# Patient Record
Sex: Female | Born: 1985 | Race: White | Hispanic: No | Marital: Married | Smoking: Never smoker
Health system: Southern US, Community
[De-identification: ages and names within clinical notes are randomized; demographics above are authoritative.]

## PROBLEM LIST (undated history)

## (undated) DIAGNOSIS — Z803 Family history of malignant neoplasm of breast: Secondary | ICD-10-CM

## (undated) DIAGNOSIS — F419 Anxiety disorder, unspecified: Secondary | ICD-10-CM

## (undated) DIAGNOSIS — Z789 Other specified health status: Secondary | ICD-10-CM

## (undated) DIAGNOSIS — D241 Benign neoplasm of right breast: Secondary | ICD-10-CM

## (undated) HISTORY — DX: Other disorders of iron metabolism: E83.19

## (undated) HISTORY — PX: OTHER SURGICAL HISTORY: SHX169

## (undated) HISTORY — DX: Family history of malignant neoplasm of breast: Z80.3

## (undated) HISTORY — PX: BREAST SURGERY: SHX581

## (undated) HISTORY — DX: Benign neoplasm of right breast: D24.1

---
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
## 2013-10-30 ENCOUNTER — Ambulatory Visit: Payer: Self-pay | Admitting: Oncology

## 2013-11-20 ENCOUNTER — Ambulatory Visit: Payer: Self-pay | Admitting: Internal Medicine

## 2013-12-16 ENCOUNTER — Ambulatory Visit: Payer: Self-pay | Admitting: Internal Medicine

## 2014-01-15 ENCOUNTER — Ambulatory Visit: Payer: Self-pay | Admitting: Internal Medicine

## 2014-02-06 LAB — CBC CANCER CENTER
Basophil #: 0.1 x10 3/mm (ref 0.0–0.1)
Basophil %: 2.1 %
EOS ABS: 0.1 x10 3/mm (ref 0.0–0.7)
Eosinophil %: 2.9 %
HCT: 47.2 % — ABNORMAL HIGH (ref 35.0–47.0)
HGB: 15.9 g/dL (ref 12.0–16.0)
LYMPHS ABS: 2.3 x10 3/mm (ref 1.0–3.6)
Lymphocyte %: 54.6 %
MCH: 33.3 pg (ref 26.0–34.0)
MCHC: 33.8 g/dL (ref 32.0–36.0)
MCV: 99 fL (ref 80–100)
MONO ABS: 0.3 x10 3/mm (ref 0.2–0.9)
Monocyte %: 7.8 %
Neutrophil #: 1.4 x10 3/mm (ref 1.4–6.5)
Neutrophil %: 32.6 %
Platelet: 271 x10 3/mm (ref 150–440)
RBC: 4.79 10*6/uL (ref 3.80–5.20)
RDW: 12.4 % (ref 11.5–14.5)
WBC: 4.3 x10 3/mm (ref 3.6–11.0)

## 2014-02-06 LAB — IRON AND TIBC
IRON SATURATION: 81 %
Iron Bind.Cap.(Total): 284 ug/dL (ref 250–450)
Iron: 230 ug/dL — ABNORMAL HIGH (ref 50–170)
UNBOUND IRON-BIND. CAP.: 54 ug/dL

## 2014-02-06 LAB — FERRITIN: FERRITIN (ARMC): 105 ng/mL (ref 8–388)

## 2014-02-15 ENCOUNTER — Ambulatory Visit: Payer: Self-pay | Admitting: Internal Medicine

## 2014-03-06 LAB — CBC CANCER CENTER
BASOS PCT: 2.9 %
Basophil #: 0.1 x10 3/mm (ref 0.0–0.1)
EOS PCT: 3.2 %
Eosinophil #: 0.1 x10 3/mm (ref 0.0–0.7)
HCT: 44.3 % (ref 35.0–47.0)
HGB: 14.7 g/dL (ref 12.0–16.0)
LYMPHS ABS: 2.3 x10 3/mm (ref 1.0–3.6)
LYMPHS PCT: 55 %
MCH: 33.3 pg (ref 26.0–34.0)
MCHC: 33.3 g/dL (ref 32.0–36.0)
MCV: 100 fL (ref 80–100)
Monocyte #: 0.4 x10 3/mm (ref 0.2–0.9)
Monocyte %: 9.6 %
NEUTROS PCT: 29.3 %
Neutrophil #: 1.2 x10 3/mm — ABNORMAL LOW (ref 1.4–6.5)
Platelet: 265 x10 3/mm (ref 150–440)
RBC: 4.42 10*6/uL (ref 3.80–5.20)
RDW: 13 % (ref 11.5–14.5)
WBC: 4.2 x10 3/mm (ref 3.6–11.0)

## 2014-03-06 LAB — IRON AND TIBC
IRON BIND. CAP.(TOTAL): 296 ug/dL (ref 250–450)
Iron Saturation: 74 %
Iron: 218 ug/dL — ABNORMAL HIGH (ref 50–170)
UNBOUND IRON-BIND. CAP.: 78 ug/dL

## 2014-03-06 LAB — FERRITIN: Ferritin (ARMC): 47 ng/mL (ref 8–388)

## 2014-03-17 ENCOUNTER — Ambulatory Visit: Payer: Self-pay | Admitting: Oncology

## 2014-06-11 ENCOUNTER — Ambulatory Visit: Payer: Self-pay | Admitting: Oncology

## 2014-06-16 ENCOUNTER — Ambulatory Visit
Admit: 2014-06-16 | Disposition: A | Discharge status: Home / Self Care | Payer: Self-pay | Attending: Oncology | Admitting: Oncology

## 2014-12-10 ENCOUNTER — Inpatient Hospital Stay: Payer: 59 | Attending: Oncology

## 2014-12-10 ENCOUNTER — Ambulatory Visit: Payer: Self-pay | Admitting: Oncology

## 2014-12-10 ENCOUNTER — Other Ambulatory Visit: Payer: Self-pay | Admitting: *Deleted

## 2014-12-10 ENCOUNTER — Other Ambulatory Visit: Payer: Self-pay

## 2014-12-10 ENCOUNTER — Inpatient Hospital Stay: Payer: 59

## 2014-12-10 ENCOUNTER — Inpatient Hospital Stay: Payer: 59 | Admitting: Family Medicine

## 2014-12-11 ENCOUNTER — Inpatient Hospital Stay: Payer: 59

## 2014-12-11 LAB — CBC WITH DIFFERENTIAL/PLATELET
BASOS ABS: 0 10*3/uL (ref 0–0.1)
BASOS PCT: 1 %
Eosinophils Absolute: 0.2 10*3/uL (ref 0–0.7)
Eosinophils Relative: 3 %
HEMATOCRIT: 40.4 % (ref 35.0–47.0)
HEMOGLOBIN: 14.2 g/dL (ref 12.0–16.0)
Lymphocytes Relative: 32 %
Lymphs Abs: 1.9 10*3/uL (ref 1.0–3.6)
MCH: 33.8 pg (ref 26.0–34.0)
MCHC: 35.2 g/dL (ref 32.0–36.0)
MCV: 96 fL (ref 80.0–100.0)
MONOS PCT: 6 %
Monocytes Absolute: 0.4 10*3/uL (ref 0.2–0.9)
NEUTROS ABS: 3.5 10*3/uL (ref 1.4–6.5)
NEUTROS PCT: 58 %
Platelets: 227 10*3/uL (ref 150–440)
RBC: 4.21 MIL/uL (ref 3.80–5.20)
RDW: 11.8 % (ref 11.5–14.5)
WBC: 6.1 10*3/uL (ref 3.6–11.0)

## 2014-12-11 LAB — IRON AND TIBC
Iron: 264 ug/dL — ABNORMAL HIGH (ref 28–170)
SATURATION RATIOS: 82 % — AB (ref 10.4–31.8)
TIBC: 322 ug/dL (ref 250–450)
UIBC: 58 ug/dL

## 2014-12-11 LAB — FERRITIN: Ferritin: 52 ng/mL (ref 11–307)

## 2015-01-26 ENCOUNTER — Telehealth: Payer: Self-pay | Admitting: *Deleted

## 2015-01-26 NOTE — Telephone Encounter (Signed)
Pder Dr Orlie Dakin, she does not need a phlebotomy and to schedule her an appt for Feb 2017 for lab, md, possible phlebotomy Message sent to schedulers and left a message on pt VM regarding this

## 2015-01-26 NOTE — Telephone Encounter (Signed)
Called stating that she had her labs drawn and she was wondering if she is going to get a call for an appt to see md and get a phlebotomy

## 2015-01-26 NOTE — Telephone Encounter (Signed)
Have left message for pt to find out where she had her labs drawn

## 2015-06-02 ENCOUNTER — Other Ambulatory Visit: Payer: 59

## 2015-06-02 ENCOUNTER — Inpatient Hospital Stay (HOSPITAL_BASED_OUTPATIENT_CLINIC_OR_DEPARTMENT_OTHER): Payer: 59

## 2015-06-02 ENCOUNTER — Encounter: Payer: Self-pay | Admitting: Oncology

## 2015-06-02 ENCOUNTER — Inpatient Hospital Stay: Payer: 59 | Attending: Oncology | Admitting: Oncology

## 2015-06-02 ENCOUNTER — Inpatient Hospital Stay: Payer: 59

## 2015-06-02 DIAGNOSIS — Z79899 Other long term (current) drug therapy: Secondary | ICD-10-CM | POA: Insufficient documentation

## 2015-06-02 LAB — CBC WITH DIFFERENTIAL/PLATELET
Basophils Absolute: 0.1 10*3/uL (ref 0–0.1)
Basophils Relative: 1 %
EOS PCT: 6 %
Eosinophils Absolute: 0.3 10*3/uL (ref 0–0.7)
HCT: 41 % (ref 35.0–47.0)
Hemoglobin: 14.3 g/dL (ref 12.0–16.0)
LYMPHS ABS: 1.9 10*3/uL (ref 1.0–3.6)
LYMPHS PCT: 42 %
MCH: 33.6 pg (ref 26.0–34.0)
MCHC: 34.8 g/dL (ref 32.0–36.0)
MCV: 96.7 fL (ref 80.0–100.0)
MONO ABS: 0.4 10*3/uL (ref 0.2–0.9)
MONOS PCT: 9 %
Neutro Abs: 1.8 10*3/uL (ref 1.4–6.5)
Neutrophils Relative %: 42 %
PLATELETS: 253 10*3/uL (ref 150–440)
RBC: 4.24 MIL/uL (ref 3.80–5.20)
RDW: 12.2 % (ref 11.5–14.5)
WBC: 4.4 10*3/uL (ref 3.6–11.0)

## 2015-06-02 LAB — FERRITIN: FERRITIN: 55 ng/mL (ref 11–307)

## 2015-06-02 LAB — IRON AND TIBC
IRON: 199 ug/dL — AB (ref 28–170)
Saturation Ratios: 64 % — ABNORMAL HIGH (ref 10.4–31.8)
TIBC: 313 ug/dL (ref 250–450)
UIBC: 114 ug/dL

## 2015-06-02 NOTE — Progress Notes (Signed)
Patient wold like to discuss how often she should get phlebotomy.

## 2015-06-02 NOTE — Progress Notes (Signed)
[facility]  Telephone:([phone] [phone]  ID: Tammy Barton OB: September 28, 1985  MR#: [id number]  AOZ#:[id number]  Patient Care Team: No Pcp Per Patient as PCP - General (General Practice)  CHIEF COMPLAINT:  Chief Complaint  Patient presents with  . Hemochromatosis    INTERVAL HISTORY: Patient last seen in clinic in February 2016. Patient returns to clinic today for further evaluation, laboratory work, and consideration of additional phlebotomy. She continues to feel well and is asymptomatic. She has no neurologic complaints. She has a good appetite and denies weight loss. She has had no recent fevers or illnesses. She has no chest pain or shortness of breath. She denies any nausea, vomiting, constipation, or diarrhea. She has no melena or hematochezia. She has no urinary complaints. Patient offers no specific complaints today.   REVIEW OF SYSTEMS:   Review of Systems  Constitutional: Negative.  Negative for malaise/fatigue.  Respiratory: Negative.  Negative for shortness of breath.   Cardiovascular: Negative.  Negative for chest pain.  Gastrointestinal: Negative.   Genitourinary: Negative.   Musculoskeletal: Negative.   Neurological: Negative.  Negative for weakness.    As per HPI. Otherwise, a complete review of systems is negatve.  PAST MEDICAL HISTORY: No past medical history on file.  PAST SURGICAL HISTORY: No past surgical history on file.  FAMILY HISTORY: Reviewed and unchanged. No reported history of malignancy or chronic disease.     ADVANCED DIRECTIVES:    HEALTH MAINTENANCE: Social History  Substance Use Topics  . Smoking status: Never Smoker   . Smokeless tobacco: Never Used  . Alcohol Use: No     Colonoscopy:  PAP:  Bone density:  Lipid panel:  Allergies no known allergies  Current Outpatient Prescriptions  Medication Sig Dispense Refill  . Norgestimate-Ethinyl Estradiol Triphasic (TRINESSA, 28,)  0.18/0.215/0.25 MG-35 MCG tablet Take by mouth.     No current facility-administered medications for this visit.    OBJECTIVE: Filed Vitals:   06/02/15 1443  BP: 117/76  Pulse: 61  Temp: 97.6 F (36.4 C)  Resp: 16     There is no height on file to calculate BMI.    ECOG FS:0 - Asymptomatic  General: Well-developed, well-nourished, no acute distress. Eyes: Pink conjunctiva, anicteric sclera. Lungs: Clear to auscultation bilaterally. Heart: Regular rate and rhythm. No rubs, murmurs, or gallops. Abdomen: Soft, nontender, nondistended. No organomegaly noted, normoactive bowel sounds. Musculoskeletal: No edema, cyanosis, or clubbing. Neuro: Alert, answering all questions appropriately. Cranial nerves grossly intact. Skin: No rashes or petechiae noted. Psych: Normal affect.   LAB RESULTS:  No results found for: NA, K, CL, CO2, GLUCOSE, BUN, CREATININE, CALCIUM, PROT, ALBUMIN, AST, ALT, ALKPHOS, BILITOT, GFRNONAA, GFRAA  Lab Results  Component Value Date   WBC 4.4 06/02/2015   NEUTROABS 1.8 06/02/2015   HGB 14.3 06/02/2015   HCT 41.0 06/02/2015   MCV 96.7 06/02/2015   PLT 253 06/02/2015   Lab Results  Component Value Date   FERRITIN 55 06/02/2015   Lab Results  Component Value Date   IRON 199* 06/02/2015   TIBC 313 06/02/2015   IRONPCTSAT 64* 06/02/2015      STUDIES: No results found.  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  PLAN:    1. Hemachromatosis:  Patient's hemoglobin continues to be within normal limits. Iron saturation is elevated to 64%, but her ferritin remained stable at 55.  She does not require additional phlebotomy today. As long as patient is premenopausal, she likely will not  require phlebotomy.  Will have a goal ferritin of 50-100. Return to clinic in 1 year with repeat laboratory work, further evaluation, and consideration of additional phlebotomy.   Patient expressed understanding and was in agreement with  this plan. She also understands that She can call clinic at any time with any questions, concerns, or complaints.    Jeralyn Ruths, MD   06/02/2015 2:55 PM

## 2016-05-22 DIAGNOSIS — Z7189 Other specified counseling: Secondary | ICD-10-CM | POA: Insufficient documentation

## 2016-05-22 DIAGNOSIS — Z9889 Other specified postprocedural states: Secondary | ICD-10-CM | POA: Insufficient documentation

## 2016-05-22 DIAGNOSIS — Z7185 Encounter for immunization safety counseling: Secondary | ICD-10-CM | POA: Insufficient documentation

## 2016-05-31 ENCOUNTER — Other Ambulatory Visit: Payer: Self-pay | Admitting: Oncology

## 2016-05-31 NOTE — Progress Notes (Signed)
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  INTERVAL HISTORY: Patient returns to clinic for yearly follow-up. She currently feels well and is asymptomatic. She also reports she recently found out she is approximately [redacted] weeks pregnant. She has no neurologic complaints. She has a good appetite and denies weight loss. She has had no recent fevers or illnesses. She has no chest pain or shortness of breath. She denies any nausea, vomiting, constipation, or diarrhea. She has no melena or hematochezia. She has no urinary complaints. Patient offers no specific complaints today.   REVIEW OF SYSTEMS:   Review of Systems  Constitutional: Negative.  Negative for fever, malaise/fatigue and weight loss.  Respiratory: Negative.  Negative for cough and shortness of breath.   Cardiovascular: Negative.  Negative for chest pain and leg swelling.  Gastrointestinal: Negative.  Negative for abdominal pain.  Genitourinary: Negative.   Musculoskeletal: Negative.   Neurological: Negative.  Negative for weakness.  Psychiatric/Behavioral: Negative.  The patient is not nervous/anxious.     As per HPI. Otherwise, a complete review of systems is negative.  PAST MEDICAL HISTORY: No past medical history on file.  PAST SURGICAL HISTORY: No past surgical history on file.  FAMILY HISTORY: Reviewed and unchanged. No reported history of malignancy or chronic disease.     ADVANCED DIRECTIVES:    HEALTH MAINTENANCE: Social History  Substance Use Topics  . Smoking status: Never Smoker  . Smokeless tobacco: Never Used  . Alcohol use No     Colonoscopy:  PAP:  Bone density:  Lipid panel:  No Known Allergies  Current Outpatient Prescriptions    Medication Sig Dispense Refill  . IRON PO Take by mouth.    . Norgestimate-Ethinyl Estradiol Triphasic (TRINESSA, 28,) 0.18/0.215/0.25 MG-35 MCG tablet Take by mouth.     No current facility-administered medications for this visit.     OBJECTIVE: Vitals:   06/01/16 1031  BP: 117/78  Pulse: 71  Resp: 18  Temp: 97.4 F (36.3 C)     There is no height or weight on file to calculate BMI.    ECOG FS:0 - Asymptomatic  General: Well-developed, well-nourished, no acute distress. Eyes: Pink conjunctiva, anicteric sclera. Lungs: Clear to auscultation bilaterally. Heart: Regular rate and rhythm. No rubs, murmurs, or gallops. Abdomen: Soft, nontender, nondistended. No organomegaly noted, normoactive bowel sounds. Musculoskeletal: No edema, cyanosis, or clubbing. Neuro: Alert, answering all questions appropriately. Cranial nerves grossly intact. Skin: No rashes or petechiae noted. Psych: Normal affect.   LAB RESULTS:  No results found for: NA, K, CL, CO2, GLUCOSE, BUN, CREATININE, CALCIUM, PROT, ALBUMIN, AST, ALT, ALKPHOS, BILITOT, GFRNONAA, GFRAA  Lab Results  Component Value Date   WBC 5.6 06/01/2016   NEUTROABS 3.6 06/01/2016   HGB 13.2 06/01/2016   HCT 37.6 06/01/2016   MCV 96.2 06/01/2016   PLT 239 06/01/2016   Lab Results  Component Value Date   FERRITIN 52 06/01/2016   Lab Results  Component Value Date   IRON 192 (H) 06/01/2016   TIBC 234 (L) 06/01/2016   IRONPCTSAT 82 (H) 06/01/2016      STUDIES: No results found.  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  PLAN:    1. Hereditary hemochromatosis:  Patient's hemoglobin continues to be within normal limits. Iron saturation Has  trended up to 82%, but her ferritin remained stable at 52. She does not require additional phlebotomy and would be hesitant to pursue given her pregnancy.  Will have a goal ferritin of 50-100. Return to clinic in 1 year with repeat laboratory work,  further evaluation, and consideration of additional phlebotomy.  2. Pregnancy: Continue monitoring for OB/GYN. Patient's ferritin can likely increase secondary to pregnancy. Monitor.  Approximately 30 minutes was spent in discussion of which greater than 50% was consultation.  Patient expressed understanding and was in agreement with this plan. She also understands that She can call clinic at any time with any questions, concerns, or complaints.    Jeralyn Ruths, MD   06/03/2016 4:26 PM

## 2016-06-01 ENCOUNTER — Inpatient Hospital Stay (HOSPITAL_BASED_OUTPATIENT_CLINIC_OR_DEPARTMENT_OTHER): Payer: Managed Care, Other (non HMO) | Admitting: Oncology

## 2016-06-01 ENCOUNTER — Inpatient Hospital Stay: Payer: Managed Care, Other (non HMO)

## 2016-06-01 ENCOUNTER — Inpatient Hospital Stay: Payer: Managed Care, Other (non HMO) | Attending: Oncology

## 2016-06-01 LAB — CBC WITH DIFFERENTIAL/PLATELET
BASOS ABS: 0 10*3/uL (ref 0–0.1)
BASOS PCT: 1 %
Eosinophils Absolute: 0.1 10*3/uL (ref 0–0.7)
Eosinophils Relative: 2 %
HEMATOCRIT: 37.6 % (ref 35.0–47.0)
HEMOGLOBIN: 13.2 g/dL (ref 12.0–16.0)
Lymphocytes Relative: 27 %
Lymphs Abs: 1.5 10*3/uL (ref 1.0–3.6)
MCH: 33.7 pg (ref 26.0–34.0)
MCHC: 35.1 g/dL (ref 32.0–36.0)
MCV: 96.2 fL (ref 80.0–100.0)
Monocytes Absolute: 0.4 10*3/uL (ref 0.2–0.9)
Monocytes Relative: 7 %
NEUTROS ABS: 3.6 10*3/uL (ref 1.4–6.5)
NEUTROS PCT: 63 %
Platelets: 239 10*3/uL (ref 150–440)
RBC: 3.91 MIL/uL (ref 3.80–5.20)
RDW: 12 % (ref 11.5–14.5)
WBC: 5.6 10*3/uL (ref 3.6–11.0)

## 2016-06-01 LAB — IRON AND TIBC
IRON: 192 ug/dL — AB (ref 28–170)
Saturation Ratios: 82 % — ABNORMAL HIGH (ref 10.4–31.8)
TIBC: 234 ug/dL — AB (ref 250–450)
UIBC: 42 ug/dL

## 2016-06-01 LAB — FERRITIN: Ferritin: 52 ng/mL (ref 11–307)

## 2016-06-01 NOTE — Progress Notes (Signed)
Patient is here for follow up, she is about [redacted] weeks pregnant, no major complaints

## 2016-10-23 ENCOUNTER — Telehealth: Payer: Self-pay | Admitting: *Deleted

## 2016-10-23 NOTE — Telephone Encounter (Signed)
[facility], I would recommend no intervention at this time. Keep follow-up as scheduled.

## 2016-10-23 NOTE — Telephone Encounter (Signed)
I am not sure myself. I have not seen any faxed results. Her last clinic visit she was pregnant so possibly her OB is drawing labs.

## 2016-10-23 NOTE — Telephone Encounter (Signed)
I called patient who states she has a question regarding her Ferritin being 13, and if she should NOT take prenatal vitamins with iron (she is currently not taking).Please advise.

## 2016-10-23 NOTE — Telephone Encounter (Signed)
I see labs in care everywhere that patient had [facility], looks like an iron panel and ferritin. She also had CBC and additional labs drawn on 10/06/16.

## 2016-10-23 NOTE — Telephone Encounter (Signed)
Patient called asking that we return her call with her results. I see no results in computer, Did she have them drawn at [facility]? Please return her call

## 2016-10-23 NOTE — Telephone Encounter (Signed)
Per Dr Orlie DakinFinnegan, can take a prenatal vitamin without iron if she can find one. Patient advised of this and was relieved

## 2017-02-22 DIAGNOSIS — N393 Stress incontinence (female) (male): Secondary | ICD-10-CM | POA: Insufficient documentation

## 2017-05-28 ENCOUNTER — Inpatient Hospital Stay: Payer: Managed Care, Other (non HMO)

## 2017-05-31 ENCOUNTER — Ambulatory Visit: Payer: Managed Care, Other (non HMO) | Admitting: Oncology

## 2017-05-31 ENCOUNTER — Other Ambulatory Visit: Payer: Managed Care, Other (non HMO)

## 2017-06-03 NOTE — Progress Notes (Signed)
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  INTERVAL HISTORY: Patient returns to clinic for yearly follow-up. She recently had a baby in September 2018.  Her pregnancy and delivery were uneventful.  She reports her daughter is healthy.  She currently feels well and is asymptomatic. She has no neurologic complaints. She has a good appetite and denies weight loss. She has had no recent fevers or illnesses. She has no chest pain or shortness of breath. She denies any nausea, vomiting, constipation, or diarrhea. She has no melena or hematochezia. She has no urinary complaints. Patient offers no specific complaints today.   REVIEW OF SYSTEMS:   Review of Systems  Constitutional: Negative.  Negative for fever, malaise/fatigue and weight loss.  Respiratory: Negative.  Negative for cough and shortness of breath.   Cardiovascular: Negative.  Negative for chest pain and leg swelling.  Gastrointestinal: Negative.  Negative for abdominal pain.  Genitourinary: Negative.   Musculoskeletal: Negative.   Neurological: Negative.  Negative for weakness.  Psychiatric/Behavioral: Negative.  The patient is not nervous/anxious.     As per HPI. Otherwise, a complete review of systems is negative.  PAST MEDICAL HISTORY: No past medical history on file.  PAST SURGICAL HISTORY: Negative.  FAMILY HISTORY: Reviewed and unchanged. No reported history of malignancy or chronic disease.     ADVANCED DIRECTIVES:    HEALTH MAINTENANCE: Social History   Tobacco Use  . Smoking status: Never Smoker  . Smokeless tobacco: Never Used  Substance Use Topics  . Alcohol use: No  . Drug use: No     Colonoscopy:  PAP:  Bone density:  Lipid  panel:  No Known Allergies  Current Outpatient Medications  Medication Sig Dispense Refill  . IRON PO Take by mouth.    . Norgestimate-Ethinyl Estradiol Triphasic (TRINESSA, 28,) 0.18/0.215/0.25 MG-35 MCG tablet Take by mouth.     No current facility-administered medications for this visit.     OBJECTIVE: Vitals:   06/04/17 1549  BP: 117/74  Pulse: 74  Resp: 16  Temp: 97.6 F (36.4 C)     There is no height or weight on file to calculate BMI.    ECOG FS:0 - Asymptomatic  General: Well-developed, well-nourished, no acute distress. Eyes: Pink conjunctiva, anicteric sclera. Lungs: Clear to auscultation bilaterally. Heart: Regular rate and rhythm. No rubs, murmurs, or gallops. Abdomen: Soft, nontender, nondistended. No organomegaly noted, normoactive bowel sounds. Musculoskeletal: No edema, cyanosis, or clubbing. Neuro: Alert, answering all questions appropriately. Cranial nerves grossly intact. Skin: No rashes or petechiae noted. Psych: Normal affect.   LAB RESULTS:  No results found for: NA, K, CL, CO2, GLUCOSE, BUN, CREATININE, CALCIUM, PROT, ALBUMIN, AST, ALT, ALKPHOS, BILITOT, GFRNONAA, GFRAA  Lab Results  Component Value Date   WBC 4.7 06/04/2017   NEUTROABS 1.7 06/04/2017   HGB 14.3 06/04/2017   HCT 41.4 06/04/2017   MCV 95.8 06/04/2017   PLT 277 06/04/2017   Lab Results  Component Value Date   FERRITIN 52 06/01/2016   Lab Results  Component Value Date   IRON 192 (H) 06/01/2016   TIBC 234 (L) 06/01/2016   IRONPCTSAT 82 (H) 06/01/2016      STUDIES: No results found.  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  PLAN:    1. Hereditary  hemochromatosis:  Patient's hemoglobin continues to be within normal limits.  Previously her iron saturations were significantly elevated, but she had a normal ferritin.  Today's results are pending at time of dictation.  She does not require phlebotomy. Will have a goal ferritin of  50-100. Return to clinic in 1 year with repeat laboratory work, further evaluation, and consideration of additional phlebotomy.  2. Pregnancy: Patient reports it was uneventful and uncomplicated.  Her daughter was born in September 2018.  She was also counseled that her daughter is by definition a hemochromatosis mutation gene carrier, but likely will be asymptomatic and not have issues with iron overload in adulthood.  Approximately 20 minutes was spent in discussion of which greater than 50% was consultation.  Patient expressed understanding and was in agreement with this plan. She also understands that She can call clinic at any time with any questions, concerns, or complaints.    Tammy Ruthsimothy J Finnegan, MD   06/04/2017 4:14 PM

## 2017-06-04 ENCOUNTER — Inpatient Hospital Stay: Payer: Managed Care, Other (non HMO)

## 2017-06-04 ENCOUNTER — Inpatient Hospital Stay: Payer: Managed Care, Other (non HMO) | Attending: Oncology | Admitting: Oncology

## 2017-06-04 LAB — CBC WITH DIFFERENTIAL/PLATELET
BASOS PCT: 2 %
Basophils Absolute: 0.1 10*3/uL (ref 0–0.1)
EOS ABS: 0.3 10*3/uL (ref 0–0.7)
EOS PCT: 7 %
HCT: 41.4 % (ref 35.0–47.0)
HEMOGLOBIN: 14.3 g/dL (ref 12.0–16.0)
Lymphocytes Relative: 46 %
Lymphs Abs: 2.2 10*3/uL (ref 1.0–3.6)
MCH: 33 pg (ref 26.0–34.0)
MCHC: 34.4 g/dL (ref 32.0–36.0)
MCV: 95.8 fL (ref 80.0–100.0)
Monocytes Absolute: 0.4 10*3/uL (ref 0.2–0.9)
Monocytes Relative: 9 %
NEUTROS PCT: 36 %
Neutro Abs: 1.7 10*3/uL (ref 1.4–6.5)
PLATELETS: 277 10*3/uL (ref 150–440)
RBC: 4.32 MIL/uL (ref 3.80–5.20)
RDW: 12.2 % (ref 11.5–14.5)
WBC: 4.7 10*3/uL (ref 3.6–11.0)

## 2017-06-04 LAB — IRON AND TIBC
Iron: 159 ug/dL (ref 28–170)
SATURATION RATIOS: 66 % — AB (ref 10.4–31.8)
TIBC: 241 ug/dL — AB (ref 250–450)
UIBC: 82 ug/dL

## 2017-06-04 LAB — FERRITIN: Ferritin: 29 ng/mL (ref 11–307)

## 2017-06-04 NOTE — Progress Notes (Signed)
Pt in for follow up for hemachromatosis following having baby in September.Pt denies any concerns or difficulties at this time.

## 2018-05-30 ENCOUNTER — Inpatient Hospital Stay: Payer: 59 | Attending: Oncology

## 2018-05-30 LAB — CBC WITH DIFFERENTIAL/PLATELET
Abs Immature Granulocytes: 0.01 10*3/uL (ref 0.00–0.07)
BASOS ABS: 0.1 10*3/uL (ref 0.0–0.1)
BASOS PCT: 1 %
EOS ABS: 0.3 10*3/uL (ref 0.0–0.5)
Eosinophils Relative: 5 %
HCT: 40.5 % (ref 36.0–46.0)
Hemoglobin: 13.5 g/dL (ref 12.0–15.0)
IMMATURE GRANULOCYTES: 0 %
Lymphocytes Relative: 37 %
Lymphs Abs: 2.3 10*3/uL (ref 0.7–4.0)
MCH: 32.3 pg (ref 26.0–34.0)
MCHC: 33.3 g/dL (ref 30.0–36.0)
MCV: 96.9 fL (ref 80.0–100.0)
Monocytes Absolute: 0.5 10*3/uL (ref 0.1–1.0)
Monocytes Relative: 7 %
NEUTROS PCT: 50 %
NRBC: 0 % (ref 0.0–0.2)
Neutro Abs: 3.2 10*3/uL (ref 1.7–7.7)
PLATELETS: 233 10*3/uL (ref 150–400)
RBC: 4.18 MIL/uL (ref 3.87–5.11)
RDW: 11.8 % (ref 11.5–15.5)
WBC: 6.3 10*3/uL (ref 4.0–10.5)

## 2018-05-30 LAB — IRON AND TIBC
IRON: 147 ug/dL (ref 28–170)
SATURATION RATIOS: 70 % — AB (ref 10.4–31.8)
TIBC: 212 ug/dL — AB (ref 250–450)
UIBC: 65 ug/dL

## 2018-05-30 LAB — FERRITIN: Ferritin: 25 ng/mL (ref 11–307)

## 2018-05-31 ENCOUNTER — Other Ambulatory Visit: Payer: Self-pay | Admitting: *Deleted

## 2018-06-02 NOTE — Progress Notes (Signed)
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  INTERVAL HISTORY: Patient returns to clinic today for repeat laboratory work and routine yearly evaluation.  She continues to feel well and remains asymptomatic. She has no neurologic complaints. She has a good appetite and denies weight loss.  She denies any recent fevers or illnesses.  She has no chest pain or shortness of breath. She denies any nausea, vomiting, constipation, or diarrhea. She has no melena or hematochezia. She has no urinary complaints.  Patient feels at her baseline offers no specific complaints today.   REVIEW OF SYSTEMS:   Review of Systems  Constitutional: Negative.  Negative for fever, malaise/fatigue and weight loss.  Respiratory: Negative.  Negative for cough and shortness of breath.   Cardiovascular: Negative.  Negative for chest pain and leg swelling.  Gastrointestinal: Negative.  Negative for abdominal pain.  Genitourinary: Negative.  Negative for hematuria.  Musculoskeletal: Negative.  Negative for back pain.  Skin: Negative.  Negative for rash.  Neurological: Negative.  Negative for dizziness, focal weakness, weakness and headaches.  Psychiatric/Behavioral: Negative.  The patient is not nervous/anxious.     As per HPI. Otherwise, a complete review of systems is negative.  PAST MEDICAL HISTORY: No past medical history on file.  PAST SURGICAL HISTORY: Negative.  FAMILY HISTORY: Reviewed and unchanged. No reported history of malignancy or chronic disease.     ADVANCED DIRECTIVES:    HEALTH MAINTENANCE: Social History   Tobacco Use  . Smoking status: Never Smoker  . Smokeless tobacco: Never Used  Substance Use Topics  . Alcohol use: No    . Drug use: No     Colonoscopy:  PAP:  Bone density:  Lipid panel:  No Known Allergies  No current outpatient medications on file.   No current facility-administered medications for this visit.     OBJECTIVE: Vitals:   06/03/18 1449  BP: 112/64  Pulse: 65  Temp: (!) 97.1 F (36.2 C)     Body mass index is 23.05 kg/m.    ECOG FS:0 - Asymptomatic  General: Well-developed, well-nourished, no acute distress. Eyes: Pink conjunctiva, anicteric sclera. HEENT: Normocephalic, moist mucous membranes. Lungs: Clear to auscultation bilaterally. Heart: Regular rate and rhythm. No rubs, murmurs, or gallops. Abdomen: Soft, nontender, nondistended. No organomegaly noted, normoactive bowel sounds. Musculoskeletal: No edema, cyanosis, or clubbing. Neuro: Alert, answering all questions appropriately. Cranial nerves grossly intact. Skin: No rashes or petechiae noted. Psych: Normal affect.  LAB RESULTS:  No results found for: NA, K, CL, CO2, GLUCOSE, BUN, CREATININE, CALCIUM, PROT, ALBUMIN, AST, ALT, ALKPHOS, BILITOT, GFRNONAA, GFRAA  Lab Results  Component Value Date   WBC 6.3 05/30/2018   NEUTROABS 3.2 05/30/2018   HGB 13.5 05/30/2018   HCT 40.5 05/30/2018   MCV 96.9 05/30/2018   PLT 233 05/30/2018   Lab Results  Component Value Date   FERRITIN 25 05/30/2018   Lab Results  Component Value Date   IRON 147 05/30/2018   TIBC 212 (L) 05/30/2018   IRONPCTSAT 70 (H) 05/30/2018      STUDIES: No results found.  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  PLAN:    1. Hereditary hemochromatosis: Patient's hemoglobin continues to be within normal limits.  Her ferritin remains well below her goal of less than  100.  Iron saturations have been persistently elevated since at least August 2016 ranging from 64 to 82%.  No intervention is needed at this time.  Patient does not require a phlebotomy.  After lengthy discussion with the patient, is agreed  upon that no further follow-up is necessary.  Please refer patient back if there are any questions or concerns.   I spent a total of 20 minutes face-to-face with the patient of which greater than 50% of the visit was spent in counseling and coordination of care as detailed above.   Patient expressed understanding and was in agreement with this plan. She also understands that She can call clinic at any time with any questions, concerns, or complaints.    Jeralyn Ruths, MD   06/05/2018 6:15 AM

## 2018-06-03 ENCOUNTER — Other Ambulatory Visit: Payer: Managed Care, Other (non HMO)

## 2018-06-03 ENCOUNTER — Other Ambulatory Visit: Payer: Self-pay

## 2018-06-03 ENCOUNTER — Inpatient Hospital Stay (HOSPITAL_BASED_OUTPATIENT_CLINIC_OR_DEPARTMENT_OTHER): Payer: 59 | Admitting: Oncology

## 2018-06-03 NOTE — Progress Notes (Signed)
Patient is here today to follow up on her hereditary hemochromatosis. Patient stated that for the past year she had been doing well with no complaints.

## 2019-01-13 ENCOUNTER — Ambulatory Visit (INDEPENDENT_AMBULATORY_CARE_PROVIDER_SITE_OTHER): Payer: 59 | Admitting: Certified Nurse Midwife

## 2019-01-13 ENCOUNTER — Encounter: Payer: Self-pay | Admitting: Certified Nurse Midwife

## 2019-01-13 ENCOUNTER — Other Ambulatory Visit: Payer: Self-pay

## 2019-01-13 VITALS — BP 104/73 | HR 72 | Ht 65.0 in | Wt 146.0 lb

## 2019-01-13 DIAGNOSIS — Z3201 Encounter for pregnancy test, result positive: Secondary | ICD-10-CM | POA: Diagnosis not present

## 2019-01-13 DIAGNOSIS — N926 Irregular menstruation, unspecified: Secondary | ICD-10-CM | POA: Diagnosis not present

## 2019-01-13 LAB — POCT URINE PREGNANCY: Preg Test, Ur: POSITIVE — AB

## 2019-01-13 NOTE — Patient Instructions (Signed)

## 2019-01-13 NOTE — Progress Notes (Signed)
Subjective:    Tammy Barton is a 33 y.o. female who presents for evaluation of amenorrhea. She believes she could be pregnant. Pregnancy is desired. Sexual Activity: single partner, contraception: none. Current symptoms also include: nausea. Last period was normal.   No LMP recorded. The following portions of the patient's history were reviewed and updated as appropriate: allergies, current medications, past family history, past medical history, past social history, past surgical history and problem list.  Review of Systems Pertinent items are noted in HPI.     Objective:    There were no vitals taken for this visit. General: alert, cooperative, appears stated age, no distress and no acute distress    Lab Review Urine HCG: positive    Assessment:    Absence of menstruation.     Plan:     Positive: EDC: 09/14/19. Briefly discussed pre-natal care options. MD or Midwifery care. Pt transferring from birth center in [place] and prefers Midwifery care. Pregnancy,Encouraged well-balanced diet, plenty of rest when needed, pre-natal vitamins daily and walking for exercise. Discussed self-help for nausea, avoiding OTC medications until consulting provider or pharmacist, other than Tylenol as needed, minimal caffeine (1-2 cups daily) and avoiding alcohol. She will schedule her u/s for dating/viability in 2 wks, her nurse visit in 5 wks ( [redacted] wks pregnant) and her initial NOB visit in 7 wks ( [redacted] wks pregnant).   Philip Aspen, CNM

## 2019-01-28 ENCOUNTER — Other Ambulatory Visit: Payer: Self-pay

## 2019-01-28 ENCOUNTER — Ambulatory Visit (INDEPENDENT_AMBULATORY_CARE_PROVIDER_SITE_OTHER): Payer: 59

## 2019-01-28 DIAGNOSIS — Z3687 Encounter for antenatal screening for uncertain dates: Secondary | ICD-10-CM

## 2019-01-28 DIAGNOSIS — N926 Irregular menstruation, unspecified: Secondary | ICD-10-CM

## 2019-01-29 ENCOUNTER — Other Ambulatory Visit: Payer: Self-pay | Admitting: Certified Nurse Midwife

## 2019-01-29 DIAGNOSIS — Z87898 Personal history of other specified conditions: Secondary | ICD-10-CM

## 2019-01-29 NOTE — Progress Notes (Signed)
Pelvic floor referral for history of urinary incontinence postpartum with last pregnancy per pt request.   Philip Aspen, CNM

## 2019-03-03 ENCOUNTER — Ambulatory Visit (INDEPENDENT_AMBULATORY_CARE_PROVIDER_SITE_OTHER): Payer: 59 | Admitting: Certified Nurse Midwife

## 2019-03-03 ENCOUNTER — Other Ambulatory Visit (HOSPITAL_COMMUNITY)
Admission: RE | Admit: 2019-03-03 | Discharge: 2019-03-03 | Disposition: A | Discharge status: Home / Self Care | Payer: 59 | Source: Ambulatory Visit | Attending: Certified Nurse Midwife | Admitting: Certified Nurse Midwife

## 2019-03-03 ENCOUNTER — Encounter: Payer: Self-pay | Admitting: Certified Nurse Midwife

## 2019-03-03 ENCOUNTER — Other Ambulatory Visit: Payer: Self-pay

## 2019-03-03 VITALS — BP 105/69 | HR 89 | Wt 142.2 lb

## 2019-03-03 DIAGNOSIS — Z3480 Encounter for supervision of other normal pregnancy, unspecified trimester: Secondary | ICD-10-CM | POA: Insufficient documentation

## 2019-03-03 NOTE — Progress Notes (Signed)
NEW OB HISTORY AND PHYSICAL  SUBJECTIVE:       Krista Godsil is a 33 y.o. [id number] female, Patient's last menstrual period was 12/08/2018 (exact date)., Estimated Date of Delivery: 09/14/19, [redacted]w[redacted]d, presents today for establishment of Prenatal Care.She has no unusual complaints.  Gynecologic History Patient's last menstrual period was 12/08/2018 (exact date). Normal Contraception: none Last Pap:05/20/2015. Results were: normal  Obstetric History OB History  Gravida Para Term Preterm AB Living  2 1 1     1   SAB TAB Ectopic Multiple Live Births          1    # Outcome Date GA Lbr Len/2nd Weight Sex Delivery Anes PTL Lv  2 Current           1 Term 01/06/17   8 lb (3.629 kg) F Vag-Spont   LIV    Past Medical History:  Diagnosis Date  . Iron overload     Past Surgical History:  Procedure Laterality Date  . BREAST SURGERY      Current Outpatient Medications on File Prior to Visit  Medication Sig Dispense Refill  . Prenatal Vit-Fe Fumarate-FA (PRENATAL MULTIVITAMIN) TABS tablet Take 1 tablet by mouth daily at 12 noon.     No current facility-administered medications on file prior to visit.     No Known Allergies  Social History   Socioeconomic History  . Marital status: Married    Spouse name: Not on file  . Number of children: Not on file  . Years of education: Not on file  . Highest education level: Not on file  Occupational History  . Not on file  Social Needs  . Financial resource strain: Not on file  . Food insecurity    Worry: Not on file    Inability: Not on file  . Transportation needs    Medical: Not on file    Non-medical: Not on file  Tobacco Use  . Smoking status: Never Smoker  . Smokeless tobacco: Never Used  Substance and Sexual Activity  . Alcohol use: No  . Drug use: No  . Sexual activity: Yes    Birth control/protection: None  Lifestyle  . Physical activity    Days per week: Not on file    Minutes per session: Not on file  .  Stress: Not on file  Relationships  . Social Herbalist on phone: Not on file    Gets together: Not on file    Attends religious service: Not on file    Active member of club or organization: Not on file    Attends meetings of clubs or organizations: Not on file    Relationship status: Not on file  . Intimate partner violence    Fear of current or ex partner: Not on file    Emotionally abused: Not on file    Physically abused: Not on file    Forced sexual activity: Not on file  Other Topics Concern  . Not on file  Social History Narrative  . Not on file    Family History  Problem Relation Age of Onset  . Diabetes Paternal Grandmother     The following portions of the patient's history were reviewed and updated as appropriate: allergies, current medications, past OB history, past medical history, past surgical history, past family history, past social history, and problem list.  Exercises daily.   OBJECTIVE: Initial Physical Exam (New OB)  GENERAL APPEARANCE: alert, well appearing, in no apparent distress,  oriented to person, place and time HEAD: normocephalic, atraumatic MOUTH: mucous membranes moist, pharynx normal without lesions THYROID: no thyromegaly or masses present BREASTS: no masses noted, no significant tenderness, no palpable axillary nodes, no skin changes LUNGS: clear to auscultation, no wheezes, rales or rhonchi, symmetric air entry HEART: regular rate and rhythm, no murmurs ABDOMEN: soft, nontender, nondistended, no abnormal masses, no epigastric pain EXTREMITIES: no redness or tenderness in the calves or thighs SKIN: normal coloration and turgor, no rashes LYMPH NODES: no adenopathy palpable NEUROLOGIC: alert, oriented, normal speech, no focal findings or movement disorder noted  PELVIC EXAM EXTERNAL GENITALIA: normal appearing vulva with no masses, tenderness or lesions VAGINA: no abnormal discharge or lesions CERVIX: no lesions or cervical  motion tenderness UTERUS: gravid ADNEXA: no masses palpable and nontender OB EXAM PELVIMETRY: appears adequate RECTUM: exam not indicated  ASSESSMENT: Normal pregnancy  PLAN: sNew OB counseling: The patient has been given an overview regarding routine prenatal care. Recommendations regarding diet, weight gain, and exercise in pregnancy were given. Prenatal testing, optional genetic testing, carrier screening , and ultrasound use in pregnancy were reviewed. Declines genetic testing.  Benefits of Breast Feeding were discussed. PT has history of urinary incontinence after last delivery. Has seen Pelvic floor PT and will see her again @ 36 wks.  The patient is encouraged to consider nursing her baby post partum.   [name], CNM

## 2019-03-03 NOTE — Patient Instructions (Signed)

## 2019-03-04 LAB — POCT URINALYSIS DIPSTICK OB
Bilirubin, UA: NEGATIVE
Blood, UA: NEGATIVE
Glucose, UA: NEGATIVE
Ketones, UA: NEGATIVE
Leukocytes, UA: NEGATIVE
Nitrite, UA: NEGATIVE
POC,PROTEIN,UA: NEGATIVE
Spec Grav, UA: >=1.03 — AB (ref 1.010–1.025)
Urobilinogen, UA: 0.2 E.U./dL
pH, UA: 5 (ref 5.0–8.0)

## 2019-03-04 LAB — URINALYSIS, ROUTINE W REFLEX MICROSCOPIC
Bilirubin, UA: NEGATIVE
Glucose, UA: NEGATIVE
Ketones, UA: NEGATIVE
Leukocytes,UA: NEGATIVE
Nitrite, UA: NEGATIVE
Protein,UA: NEGATIVE
RBC, UA: NEGATIVE
Specific Gravity, UA: 1.018 (ref 1.005–1.030)
Urobilinogen, Ur: 0.2 mg/dL (ref 0.2–1.0)
pH, UA: 7 (ref 5.0–7.5)

## 2019-03-04 NOTE — Addendum Note (Signed)
Addended by: Raliegh Ip on: 03/04/2019 11:42 AM   Modules accepted: Orders

## 2019-03-05 LAB — CBC WITH DIFFERENTIAL
Basophils Absolute: 0.1 10*3/uL (ref 0.0–0.2)
Basos: 1 %
EOS (ABSOLUTE): 0.1 10*3/uL (ref 0.0–0.4)
Eos: 2 %
Hematocrit: 38.3 % (ref 34.0–46.6)
Hemoglobin: 13.4 g/dL (ref 11.1–15.9)
Immature Grans (Abs): 0 10*3/uL (ref 0.0–0.1)
Immature Granulocytes: 0 %
Lymphocytes Absolute: 1.9 10*3/uL (ref 0.7–3.1)
Lymphs: 23 %
MCH: 32.9 pg (ref 26.6–33.0)
MCHC: 35 g/dL (ref 31.5–35.7)
MCV: 94 fL (ref 79–97)
Monocytes Absolute: 0.6 10*3/uL (ref 0.1–0.9)
Monocytes: 8 %
Neutrophils Absolute: 5.5 10*3/uL (ref 1.4–7.0)
Neutrophils: 66 %
RBC: 4.07 x10E6/uL (ref 3.77–5.28)
RDW: 11.5 % — ABNORMAL LOW (ref 11.7–15.4)
WBC: 8.3 10*3/uL (ref 3.4–10.8)

## 2019-03-05 LAB — URINE CULTURE: Organism ID, Bacteria: NO GROWTH

## 2019-03-05 LAB — CYTOLOGY - PAP
Comment: NEGATIVE
Diagnosis: NEGATIVE
High risk HPV: NEGATIVE

## 2019-03-05 LAB — HEPATITIS B SURFACE ANTIGEN: Hepatitis B Surface Ag: NEGATIVE

## 2019-03-05 LAB — RUBELLA SCREEN: Rubella Antibodies, IGG: 1.68 index (ref >0.99)

## 2019-03-05 LAB — ABO AND RH: Rh Factor: POSITIVE

## 2019-03-05 LAB — ANTIBODY SCREEN: Antibody Screen: NEGATIVE

## 2019-03-05 LAB — RPR: RPR Ser Ql: NONREACTIVE

## 2019-03-05 LAB — VARICELLA ZOSTER ANTIBODY, IGG: Varicella zoster IgG: 540 index (ref >165)

## 2019-03-07 LAB — GC/CHLAMYDIA PROBE AMP
Chlamydia trachomatis, NAA: NEGATIVE
Neisseria Gonorrhoeae by PCR: NEGATIVE

## 2019-03-31 ENCOUNTER — Encounter: Payer: Self-pay | Admitting: Certified Nurse Midwife

## 2019-03-31 ENCOUNTER — Other Ambulatory Visit: Payer: Self-pay

## 2019-03-31 ENCOUNTER — Ambulatory Visit (INDEPENDENT_AMBULATORY_CARE_PROVIDER_SITE_OTHER): Payer: 59 | Admitting: Certified Nurse Midwife

## 2019-03-31 VITALS — BP 105/60 | HR 74 | Wt 140.4 lb

## 2019-03-31 DIAGNOSIS — Z3482 Encounter for supervision of other normal pregnancy, second trimester: Secondary | ICD-10-CM

## 2019-03-31 LAB — POCT URINALYSIS DIPSTICK OB
Bilirubin, UA: NEGATIVE
Blood, UA: NEGATIVE
Glucose, UA: NEGATIVE
Ketones, UA: NEGATIVE
Leukocytes, UA: NEGATIVE
Nitrite, UA: NEGATIVE
POC,PROTEIN,UA: NEGATIVE
Spec Grav, UA: 1.02 (ref 1.010–1.025)
Urobilinogen, UA: 0.2 E.U./dL
pH, UA: 5 (ref 5.0–8.0)

## 2019-03-31 NOTE — Progress Notes (Signed)
ROB doing well. Feels fluttering. Discussed anatomy u/s next visit she verbalizes and agrees to  Plan, Discussed staffing changes. Follow up 4 wks for u/s and ROB.  Philip Aspen, CNM

## 2019-03-31 NOTE — Addendum Note (Signed)
Addended by: Raliegh Ip on: 03/31/2019 03:59 PM   Modules accepted: Orders

## 2019-03-31 NOTE — Patient Instructions (Signed)

## 2019-04-18 NOTE — L&D Delivery Note (Signed)
Delivery Note  [phone] Received call from birthing suites patient presenting to unit with urge to push. SVE: 8-9 cm, vertex per RN. Advised CNM en route to unit.   Spontaneous birth of liveborn female patient at 63, see note from Dr. Bonney Aid.   1008 CNM in room to relieve MD. Infant skin to skin and  assisted to maternal breast to start feeding. Delayed cord clamping, three (3) vessel cord, and tube of cord blood APGARs: 8, 9. Weight pending.   Spontaneous delivery of intact placenta at 1023. First degree perineal laceration hemostatic, unrepaired. Uterus firm. Rubra small. Anesthesia none. Vault check completed. Counts correct x 2. EBL 300 ml.    Initiate routine postpartum care and orders. Mom to postpartum.  Baby to Couplet care / Skin to Skin.  FOB present at bedside and overjoyed with birth of female infant, name pending.    Gunnar Bulla, CNM [facility], [facility] 09/16/2019, 11:23 AM

## 2019-04-30 ENCOUNTER — Ambulatory Visit (INDEPENDENT_AMBULATORY_CARE_PROVIDER_SITE_OTHER): Payer: 59

## 2019-04-30 ENCOUNTER — Other Ambulatory Visit: Payer: Self-pay

## 2019-04-30 ENCOUNTER — Ambulatory Visit (INDEPENDENT_AMBULATORY_CARE_PROVIDER_SITE_OTHER): Payer: 59 | Admitting: Certified Nurse Midwife

## 2019-04-30 VITALS — BP 104/66 | HR 86 | Wt 146.1 lb

## 2019-04-30 DIAGNOSIS — Z3482 Encounter for supervision of other normal pregnancy, second trimester: Secondary | ICD-10-CM

## 2019-04-30 NOTE — Progress Notes (Signed)
ROB doing well. Feels movement. U/s today for anatomy-results reviewed ( see below). Discussed upcoming visit. All questions answered. Follow up 4 wk.   [name], CNM  Patient Name: Tammy Barton DOB: [birth year] MRN: [id number] ULTRASOUND REPORT  Location: [facility] Date of Service: 04/30/2019   Indications:Anatomy Ultrasound Findings:  Mason Jim intrauterine pregnancy is visualized with FHR at 134 BPM. Biometrics give an (U/S) Gestational age of [redacted]w[redacted]d and an (U/S) EDD of 09/18/2019; this correlates with the clinically established Estimated Date of Delivery: 09/14/19  Fetal presentation is Variable.  EFW: 329 g ( 12 oz). Fetal Percentile 35  Placenta: posterior. Grade: 1 AFI: subjectively normal.  Anatomic survey is complete and normal; Gender - surprise.    Right Ovary is normal in appearance. Left Ovary is normal appearance. Survey of the adnexa demonstrates no adnexal masses. There is no free peritoneal fluid in the cul de sac.  Impression: 1. [redacted]w[redacted]d Viable Singleton Intrauterine pregnancy by U/S. 2. (U/S) EDD is consistent with Clinically established Estimated Date of Delivery: 09/14/19 . 3. Normal Anatomy Scan  Recommendations: 1.Clinical correlation with the patient's History and Physical Exam.   Jenine M. Marciano Sequin    RDMS

## 2019-04-30 NOTE — Patient Instructions (Signed)

## 2019-05-28 ENCOUNTER — Other Ambulatory Visit: Payer: Self-pay

## 2019-05-28 ENCOUNTER — Ambulatory Visit (INDEPENDENT_AMBULATORY_CARE_PROVIDER_SITE_OTHER): Payer: 59 | Admitting: Certified Nurse Midwife

## 2019-05-28 VITALS — BP 93/54 | HR 79 | Wt 150.4 lb

## 2019-05-28 DIAGNOSIS — Z3482 Encounter for supervision of other normal pregnancy, second trimester: Secondary | ICD-10-CM

## 2019-05-28 LAB — POCT URINALYSIS DIPSTICK OB
Bilirubin, UA: NEGATIVE
Blood, UA: NEGATIVE
Glucose, UA: NEGATIVE
Ketones, UA: NEGATIVE
Leukocytes, UA: NEGATIVE
Nitrite, UA: NEGATIVE
POC,PROTEIN,UA: NEGATIVE
Spec Grav, UA: 1.01 (ref 1.010–1.025)
Urobilinogen, UA: 0.2 E.U./dL
pH, UA: 5 (ref 5.0–8.0)

## 2019-05-28 NOTE — Patient Instructions (Signed)
Glucose Tolerance Test During Pregnancy Why am I having this test? The glucose tolerance test (GTT) is done to check how your body processes sugar (glucose). This is one of several tests used to diagnose diabetes that develops during pregnancy (gestational diabetes mellitus). Gestational diabetes is a temporary form of diabetes that some women develop during pregnancy. It usually occurs during the second trimester of pregnancy and goes away after delivery. Testing (screening) for gestational diabetes usually occurs between 24 and 28 weeks of pregnancy. You may have the GTT test after having a 1-hour glucose screening test if the results from that test indicate that you may have gestational diabetes. You may also have this test if:  You have a history of gestational diabetes.  You have a history of giving birth to very large babies or have experienced repeated fetal loss (stillbirth).  You have signs and symptoms of diabetes, such as: ? Changes in your vision. ? Tingling or numbness in your hands or feet. ? Changes in hunger, thirst, and urination that are not otherwise explained by your pregnancy. What is being tested? This test measures the amount of glucose in your blood at different times during a period of 3 hours. This indicates how well your body is able to process glucose. What kind of sample is taken?  Blood samples are required for this test. They are usually collected by inserting a needle into a blood vessel. How do I prepare for this test?  For 3 days before your test, eat normally. Have plenty of carbohydrate-rich foods.  Follow instructions from your health care provider about: ? Eating or drinking restrictions on the day of the test. You may be asked to not eat or drink anything other than water (fast) starting 8-10 hours before the test. ? Changing or stopping your regular medicines. Some medicines may interfere with this test. Tell a health care provider about:  All  medicines you are taking, including vitamins, herbs, eye drops, creams, and over-the-counter medicines.  Any blood disorders you have.  Any surgeries you have had.  Any medical conditions you have. What happens during the test? First, your blood glucose will be measured. This is referred to as your fasting blood glucose, since you fasted before the test. Then, you will drink a glucose solution that contains a certain amount of glucose. Your blood glucose will be measured again 1, 2, and 3 hours after drinking the solution. This test takes about 3 hours to complete. You will need to stay at the testing location during this time. During the testing period:  Do not eat or drink anything other than the glucose solution.  Do not exercise.  Do not use any products that contain nicotine or tobacco, such as cigarettes and e-cigarettes. If you need help stopping, ask your health care provider. The testing procedure may vary among health care providers and hospitals. How are the results reported? Your results will be reported as milligrams of glucose per deciliter of blood (mg/dL) or millimoles per liter (mmol/L). Your health care provider will compare your results to normal ranges that were established after testing a large group of people (reference ranges). Reference ranges may vary among labs and hospitals. For this test, common reference ranges are:  Fasting: less than 95-105 mg/dL (5.3-5.8 mmol/L).  1 hour after drinking glucose: less than 180-190 mg/dL (10.0-10.5 mmol/L).  2 hours after drinking glucose: less than 155-165 mg/dL (8.6-9.2 mmol/L).  3 hours after drinking glucose: 140-145 mg/dL (7.8-8.1 mmol/L). What do the   results mean? Results within reference ranges are considered normal, meaning that your glucose levels are well-controlled. If two or more of your blood glucose levels are high, you may be diagnosed with gestational diabetes. If only one level is high, your health care  provider may suggest repeat testing or other tests to confirm a diagnosis. Talk with your health care provider about what your results mean. Questions to ask your health care provider Ask your health care provider, or the department that is doing the test:  When will my results be ready?  How will I get my results?  What are my treatment options?  What other tests do I need?  What are my next steps? Summary  The glucose tolerance test (GTT) is one of several tests used to diagnose diabetes that develops during pregnancy (gestational diabetes mellitus). Gestational diabetes is a temporary form of diabetes that some women develop during pregnancy.  You may have the GTT test after having a 1-hour glucose screening test if the results from that test indicate that you may have gestational diabetes. You may also have this test if you have any symptoms or risk factors for gestational diabetes.  Talk with your health care provider about what your results mean. This information is not intended to replace advice given to you by your health care provider. Make sure you discuss any questions you have with your health care provider. Document Revised: 07/25/2018 Document Reviewed: 11/13/2016 Elsevier Patient Education  2020 [employer]  

## 2019-05-28 NOTE — Progress Notes (Signed)
ROB doing well. Feels good movement. Anticipatory guidance regarding 28 wk labs next visit. Discussed eating prior to visit , recommend protein. Follow up 4 wks with Marcelino Duster.   [name], CNM

## 2019-06-27 ENCOUNTER — Ambulatory Visit (INDEPENDENT_AMBULATORY_CARE_PROVIDER_SITE_OTHER): Payer: 59 | Admitting: Certified Nurse Midwife

## 2019-06-27 ENCOUNTER — Other Ambulatory Visit: Payer: 59

## 2019-06-27 ENCOUNTER — Other Ambulatory Visit: Payer: Self-pay

## 2019-06-27 VITALS — BP 87/57 | HR 88 | Wt 154.8 lb

## 2019-06-27 DIAGNOSIS — Z23 Encounter for immunization: Secondary | ICD-10-CM

## 2019-06-27 DIAGNOSIS — R7309 Other abnormal glucose: Secondary | ICD-10-CM

## 2019-06-27 DIAGNOSIS — Z131 Encounter for screening for diabetes mellitus: Secondary | ICD-10-CM

## 2019-06-27 DIAGNOSIS — Z3A28 28 weeks gestation of pregnancy: Secondary | ICD-10-CM

## 2019-06-27 DIAGNOSIS — Z113 Encounter for screening for infections with a predominantly sexual mode of transmission: Secondary | ICD-10-CM

## 2019-06-27 DIAGNOSIS — Z130 Encounter for screening for diseases of the blood and blood-forming organs and certain disorders involving the immune mechanism: Secondary | ICD-10-CM

## 2019-06-27 DIAGNOSIS — Z3493 Encounter for supervision of normal pregnancy, unspecified, third trimester: Secondary | ICD-10-CM

## 2019-06-27 LAB — POCT URINALYSIS DIPSTICK OB
Bilirubin, UA: NEGATIVE
Blood, UA: NEGATIVE
Glucose, UA: NEGATIVE
Ketones, UA: NEGATIVE
Nitrite, UA: NEGATIVE
POC,PROTEIN,UA: NEGATIVE
Spec Grav, UA: 1.01 (ref 1.010–1.025)
Urobilinogen, UA: 0.2 E.U./dL
pH, UA: 6 (ref 5.0–8.0)

## 2019-06-27 MED ORDER — TETANUS-DIPHTH-ACELL PERTUSSIS 5-2.5-18.5 LF-MCG/0.5 IM SUSP
0.5000 mL | Freq: Once | INTRAMUSCULAR | Status: AC
  Administered 2019-06-27: 0.5 mL via INTRAMUSCULAR

## 2019-06-27 NOTE — Patient Instructions (Addendum)
Breastfeeding Tips for a Good Latch Latching is how your baby's mouth attaches to your nipple to breastfeed. It is an important part of breastfeeding. Your baby may have trouble latching for a number of reasons. A poor latch may cause you to have cracked or sore nipples or other problems. Follow these instructions at home: How to position your baby  Find a comfortable place to sit or lie down. Your neck and back should be well supported.  If you are seated, place a pillow or rolled-up blanket under your baby. This will bring him or her to the level of your breast.  Make sure that your baby's belly (abdomen) is facing your belly.  Try different positions to find one that works best for you and your baby. How to help your baby latch   To start, gently rub your breast. Move your fingertips in a circle as you massage from your chest wall toward your nipple. This helps milk flow. Keep doing this during feeding if needed.  Position your breast. Hold your breast with four fingers underneath and your thumb above your nipple. Keep your fingers away from your nipple and your baby's mouth. Follow these steps to help your baby latch: 1. Rub your baby's lips gently with your finger or nipple. 2. When your baby's mouth is open wide enough, quickly bring your baby to your breast and place your whole nipple into your baby's mouth. Place as much of the colored area around your nipple (areola)as possible into your baby's mouth. 3. Your baby's tongue should be between his or her lower gum and your breast. 4. You should be able to see more areola above your baby's upper lip than below the lower lip. 5. When your baby starts sucking, you will feel a gentle pull on your nipple. You should not feel any pain. Be patient. It is common for a baby to suck for about 2-3 minutes to start the flow of breast milk. 6. Make sure that your baby's mouth is in the right position around your nipple. Your baby's lips should  make a seal on your breast and be turned outward.  General instructions  Look for these signs that your baby has latched on to your nipple: ? The baby is quietly tugging or sucking without causing you pain. ? You hear the baby swallow after every 3 or 4 sucks. ? You see movement above and in front of the baby's ears while he or she is sucking.  Be aware of these signs that your baby has not latched on to your nipple: ? The baby makes sucking sounds or smacking sounds while feeding. ? You have nipple pain.  If your baby is not latched well, put your little finger between your baby's gums and your nipple. This will break the seal. Then try to help your baby latch again.  If you keep having problems, get help from a breastfeeding specialist (Advertising copywriter). Contact a doctor if:  You have cracking or soreness in your nipples that lasts longer than 1 week.  You have nipple pain.  Your breasts are filled with too much milk (engorgement), and this does not improve after 48-72 hours.  You have a plugged milk duct and a fever.  You follow the tips for a good latch but you keep having problems or concerns.  You have a pus-like fluid coming from your breast.  Your baby is not gaining weight.  Your baby loses weight. Summary  Latching is how  your baby's mouth attaches to your nipple to breastfeed.  Try different positions for breastfeeding to find one that works best for you and your baby.  A poor latch may cause you to have cracked or sore nipples or other problems. This information is not intended to replace advice given to you by your health care provider. Make sure you discuss any questions you have with your health care provider. Document Revised: 07/24/2018 Document Reviewed: 11/08/2016 Elsevier Patient Education  2020 [employer].   Breastfeeding  Choosing to breastfeed is one of the best decisions you can make for yourself and your baby. A change in hormones during  pregnancy causes your breasts to make breast milk in your milk-producing glands. Hormones prevent breast milk from being released before your baby is born. They also prompt milk flow after birth. Once breastfeeding has begun, thoughts of your baby, as well as his or her sucking or crying, can stimulate the release of milk from your milk-producing glands. Benefits of breastfeeding Research shows that breastfeeding offers many health benefits for infants and mothers. It also offers a cost-free and convenient way to feed your baby. For your baby  Your first milk (colostrum) helps your baby's digestive system to function better.  Special cells in your milk (antibodies) help your baby to fight off infections.  Breastfed babies are less likely to develop asthma, allergies, obesity, or type 2 diabetes. They are also at lower risk for sudden infant death syndrome (SIDS).  Nutrients in breast milk are better able to meet your baby's needs compared to infant formula.  Breast milk improves your baby's brain development. For you  Breastfeeding helps to create a very special bond between you and your baby.  Breastfeeding is convenient. Breast milk costs nothing and is always available at the correct temperature.  Breastfeeding helps to burn calories. It helps you to lose the weight that you gained during pregnancy.  Breastfeeding makes your uterus return faster to its size before pregnancy. It also slows bleeding (lochia) after you give birth.  Breastfeeding helps to lower your risk of developing type 2 diabetes, osteoporosis, rheumatoid arthritis, cardiovascular disease, and breast, ovarian, uterine, and endometrial cancer later in life. Breastfeeding basics Starting breastfeeding  Find a comfortable place to sit or lie down, with your neck and back well-supported.  Place a pillow or a rolled-up blanket under your baby to bring him or her to the level of your breast (if you are seated). Nursing  pillows are specially designed to help support your arms and your baby while you breastfeed.  Make sure that your baby's tummy (abdomen) is facing your abdomen.  Gently massage your breast. With your fingertips, massage from the outer edges of your breast inward toward the nipple. This encourages milk flow. If your milk flows slowly, you may need to continue this action during the feeding.  Support your breast with 4 fingers underneath and your thumb above your nipple (make the letter "C" with your hand). Make sure your fingers are well away from your nipple and your baby's mouth.  Stroke your baby's lips gently with your finger or nipple.  When your baby's mouth is open wide enough, quickly bring your baby to your breast, placing your entire nipple and as much of the areola as possible into your baby's mouth. The areola is the colored area around your nipple. ? More areola should be visible above your baby's upper lip than below the lower lip. ? Your baby's lips should be opened  and extended outward (flanged) to ensure an adequate, comfortable latch. ? Your baby's tongue should be between his or her lower gum and your breast.  Make sure that your baby's mouth is correctly positioned around your nipple (latched). Your baby's lips should create a seal on your breast and be turned out (everted).  It is common for your baby to suck about 2-3 minutes in order to start the flow of breast milk. Latching Teaching your baby how to latch onto your breast properly is very important. An improper latch can cause nipple pain, decreased milk supply, and poor weight gain in your baby. Also, if your baby is not latched onto your nipple properly, he or she may swallow some air during feeding. This can make your baby fussy. Burping your baby when you switch breasts during the feeding can help to get rid of the air. However, teaching your baby to latch on properly is still the best way to prevent fussiness from  swallowing air while breastfeeding. Signs that your baby has successfully latched onto your nipple  Silent tugging or silent sucking, without causing you pain. Infant's lips should be extended outward (flanged).  Swallowing heard between every 3-4 sucks once your milk has started to flow (after your let-down milk reflex occurs).  Muscle movement above and in front of his or her ears while sucking. Signs that your baby has not successfully latched onto your nipple  Sucking sounds or smacking sounds from your baby while breastfeeding.  Nipple pain. If you think your baby has not latched on correctly, slip your finger into the corner of your baby's mouth to break the suction and place it between your baby's gums. Attempt to start breastfeeding again. Signs of successful breastfeeding Signs from your baby  Your baby will gradually decrease the number of sucks or will completely stop sucking.  Your baby will fall asleep.  Your baby's body will relax.  Your baby will retain a small amount of milk in his or her mouth.  Your baby will let go of your breast by himself or herself. Signs from you  Breasts that have increased in firmness, weight, and size 1-3 hours after feeding.  Breasts that are softer immediately after breastfeeding.  Increased milk volume, as well as a change in milk consistency and color by the fifth day of breastfeeding.  Nipples that are not sore, cracked, or bleeding. Signs that your baby is getting enough milk  Wetting at least 1-2 diapers during the first 24 hours after birth.  Wetting at least 5-6 diapers every 24 hours for the first week after birth. The urine should be clear or pale yellow by the age of 5 days.  Wetting 6-8 diapers every 24 hours as your baby continues to grow and develop.  At least 3 stools in a 24-hour period by the age of 5 days. The stool should be soft and yellow.  At least 3 stools in a 24-hour period by the age of 7 days. The  stool should be seedy and yellow.  No loss of weight greater than 10% of birth weight during the first 3 days of life.  Average weight gain of 4-7 oz (113-198 g) per week after the age of 4 days.  Consistent daily weight gain by the age of 5 days, without weight loss after the age of 2 weeks. After a feeding, your baby may spit up a small amount of milk. This is normal. Breastfeeding frequency and duration Frequent feeding will help  you make more milk and can prevent sore nipples and extremely full breasts (breast engorgement). Breastfeed when you feel the need to reduce the fullness of your breasts or when your baby shows signs of hunger. This is called "breastfeeding on demand." Signs that your baby is hungry include:  Increased alertness, activity, or restlessness.  Movement of the head from side to side.  Opening of the mouth when the corner of the mouth or cheek is stroked (rooting).  Increased sucking sounds, smacking lips, cooing, sighing, or squeaking.  Hand-to-mouth movements and sucking on fingers or hands.  Fussing or crying. Avoid introducing a pacifier to your baby in the first 4-6 weeks after your baby is born. After this time, you may choose to use a pacifier. Research has shown that pacifier use during the first year of a baby's life decreases the risk of sudden infant death syndrome (SIDS). Allow your baby to feed on each breast as long as he or she wants. When your baby unlatches or falls asleep while feeding from the first breast, offer the second breast. Because newborns are often sleepy in the first few weeks of life, you may need to awaken your baby to get him or her to feed. Breastfeeding times will vary from baby to baby. However, the following rules can serve as a guide to help you make sure that your baby is properly fed:  Newborns (babies 58 weeks of age or younger) may breastfeed every 1-3 hours.  Newborns should not go without breastfeeding for longer than 3  hours during the day or 5 hours during the night.  You should breastfeed your baby a minimum of 8 times in a 24-hour period. Breast milk pumping     Pumping and storing breast milk allows you to make sure that your baby is exclusively fed your breast milk, even at times when you are unable to breastfeed. This is especially important if you go back to work while you are still breastfeeding, or if you are not able to be present during feedings. Your lactation consultant can help you find a method of pumping that works best for you and give you guidelines about how long it is safe to store breast milk. Caring for your breasts while you breastfeed Nipples can become dry, cracked, and sore while breastfeeding. The following recommendations can help keep your breasts moisturized and healthy:  Avoid using soap on your nipples.  Wear a supportive bra designed especially for nursing. Avoid wearing underwire-style bras or extremely tight bras (sports bras).  Air-dry your nipples for 3-4 minutes after each feeding.  Use only cotton bra pads to absorb leaked breast milk. Leaking of breast milk between feedings is normal.  Use lanolin on your nipples after breastfeeding. Lanolin helps to maintain your skin's normal moisture barrier. Pure lanolin is not harmful (not toxic) to your baby. You may also hand express a few drops of breast milk and gently massage that milk into your nipples and allow the milk to air-dry. In the first few weeks after giving birth, some women experience breast engorgement. Engorgement can make your breasts feel heavy, warm, and tender to the touch. Engorgement peaks within 3-5 days after you give birth. The following recommendations can help to ease engorgement:  Completely empty your breasts while breastfeeding or pumping. You may want to start by applying warm, moist heat (in the shower or with warm, water-soaked hand towels) just before feeding or pumping. This increases  circulation and helps the milk flow.  If your baby does not completely empty your breasts while breastfeeding, pump any extra milk after he or she is finished.  Apply ice packs to your breasts immediately after breastfeeding or pumping, unless this is too uncomfortable for you. To do this: ? Put ice in a plastic bag. ? Place a towel between your skin and the bag. ? Leave the ice on for 20 minutes, 2-3 times a day.  Make sure that your baby is latched on and positioned properly while breastfeeding. If engorgement persists after 48 hours of following these recommendations, contact your health care provider or a Advertising copywriter. Overall health care recommendations while breastfeeding  Eat 3 healthy meals and 3 snacks every day. Well-nourished mothers who are breastfeeding need an additional 450-500 calories a day. You can meet this requirement by increasing the amount of a balanced diet that you eat.  Drink enough water to keep your urine pale yellow or clear.  Rest often, relax, and continue to take your prenatal vitamins to prevent fatigue, stress, and low vitamin and mineral levels in your body (nutrient deficiencies).  Do not use any products that contain nicotine or tobacco, such as cigarettes and e-cigarettes. Your baby may be harmed by chemicals from cigarettes that pass into breast milk and exposure to secondhand smoke. If you need help quitting, ask your health care provider.  Avoid alcohol.  Do not use illegal drugs or marijuana.  Talk with your health care provider before taking any medicines. These include over-the-counter and prescription medicines as well as vitamins and herbal supplements. Some medicines that may be harmful to your baby can pass through breast milk.  It is possible to become pregnant while breastfeeding. If birth control is desired, ask your health care provider about options that will be safe while breastfeeding your baby. Where to find more  information: [employer]: www.llli.org Contact a health care provider if:  You feel like you want to stop breastfeeding or have become frustrated with breastfeeding.  Your nipples are cracked or bleeding.  Your breasts are red, tender, or warm.  You have: ? Painful breasts or nipples. ? A swollen area on either breast. ? A fever or chills. ? Nausea or vomiting. ? Drainage other than breast milk from your nipples.  Your breasts do not become full before feedings by the fifth day after you give birth.  You feel sad and depressed.  Your baby is: ? Too sleepy to eat well. ? Having trouble sleeping. ? More than 80 week old and wetting fewer than 6 diapers in a 24-hour period. ? Not gaining weight by 28 days of age.  Your baby has fewer than 3 stools in a 24-hour period.  Your baby's skin or the white parts of his or her eyes become yellow. Get help right away if:  Your baby is overly tired (lethargic) and does not want to wake up and feed.  Your baby develops an unexplained fever. Summary  Breastfeeding offers many health benefits for infant and mothers.  Try to breastfeed your infant when he or she shows early signs of hunger.  Gently tickle or stroke your baby's lips with your finger or nipple to allow the baby to open his or her mouth. Bring the baby to your breast. Make sure that much of the areola is in your baby's mouth. Offer one side and burp the baby before you offer the other side.  Talk with your health care provider or lactation consultant if you have  questions or you face problems as you breastfeed. This information is not intended to replace advice given to you by your health care provider. Make sure you discuss any questions you have with your health care provider. Document Revised: 06/28/2017 Document Reviewed: 05/05/2016 Elsevier Patient Education  2020 [employer]   Vaginal Delivery  Vaginal delivery means that you give birth by  pushing your baby out of your birth canal (vagina). A team of health care providers will help you before, during, and after vaginal delivery. Birth experiences are unique for every woman and every pregnancy, and birth experiences vary depending on where you choose to give birth. What happens when I arrive at the birth center or hospital? Once you are in labor and have been admitted into the hospital or birth center, your health care provider may:  Review your pregnancy history and any concerns that you have.  Insert an IV into one of your veins. This may be used to give you fluids and medicines.  Check your blood pressure, pulse, temperature, and heart rate (vital signs).  Check whether your bag of water (amniotic sac) has broken (ruptured).  Talk with you about your birth plan and discuss pain control options. Monitoring Your health care provider may monitor your contractions (uterine monitoring) and your baby's heart rate (fetal monitoring). You may need to be monitored:  Often, but not continuously (intermittently).  All the time or for long periods at a time (continuously). Continuous monitoring may be needed if: ? You are taking certain medicines, such as medicine to relieve pain or make your contractions stronger. ? You have pregnancy or labor complications. Monitoring may be done by:  Placing a special stethoscope or a handheld monitoring device on your abdomen to check your baby's heartbeat and to check for contractions.  Placing monitors on your abdomen (external monitors) to record your baby's heartbeat and the frequency and length of contractions.  Placing monitors inside your uterus through your vagina (internal monitors) to record your baby's heartbeat and the frequency, length, and strength of your contractions. Depending on the type of monitor, it may remain in your uterus or on your baby's head until birth.  Telemetry. This is a type of continuous monitoring that can be  done with external or internal monitors. Instead of having to stay in bed, you are able to move around during telemetry. Physical exam Your health care provider may perform frequent physical exams. This may include:  Checking how and where your baby is positioned in your uterus.  Checking your cervix to determine: ? Whether it is thinning out (effacing). ? Whether it is opening up (dilating). What happens during labor and delivery?  Normal labor and delivery is divided into the following three stages: Stage 1  This is the longest stage of labor.  This stage can last for hours or days.  Throughout this stage, you will feel contractions. Contractions generally feel mild, infrequent, and irregular at first. They get stronger, more frequent (about every 2-3 minutes), and more regular as you move through this stage.  This stage ends when your cervix is completely dilated to 4 inches (10 cm) and completely effaced. Stage 2  This stage starts once your cervix is completely effaced and dilated and lasts until the delivery of your baby.  This stage may last from 20 minutes to 2 hours.  This is the stage where you will feel an urge to push your baby out of your vagina.  You may feel stretching and  burning pain, especially when the widest part of your baby's head passes through the vaginal opening (crowning).  Once your baby is delivered, the umbilical cord will be clamped and cut. This usually occurs after waiting a period of 1-2 minutes after delivery.  Your baby will be placed on your bare chest (skin-to-skin contact) in an upright position and covered with a warm blanket. Watch your baby for feeding cues, like rooting or sucking, and help the baby to your breast for his or her first feeding. Stage 3  This stage starts immediately after the birth of your baby and ends after you deliver the placenta.  This stage may take anywhere from 5 to 30 minutes.  After your baby has been  delivered, you will feel contractions as your body expels the placenta and your uterus contracts to control bleeding. What can I expect after labor and delivery?  After labor is over, you and your baby will be monitored closely until you are ready to go home to ensure that you are both healthy. Your health care team will teach you how to care for yourself and your baby.  You and your baby will stay in the same room (rooming in) during your hospital stay. This will encourage early bonding and successful breastfeeding.  You may continue to receive fluids and medicines through an IV.  Your uterus will be checked and massaged regularly (fundal massage).  You will have some soreness and pain in your abdomen, vagina, and the area of skin between your vaginal opening and your anus (perineum).  If an incision was made near your vagina (episiotomy) or if you had some vaginal tearing during delivery, cold compresses may be placed on your episiotomy or your tear. This helps to reduce pain and swelling.  You may be given a squirt bottle to use instead of wiping when you go to the bathroom. To use the squirt bottle, follow these steps: ? Before you urinate, fill the squirt bottle with warm water. Do not use hot water. ? After you urinate, while you are sitting on the toilet, use the squirt bottle to rinse the area around your urethra and vaginal opening. This rinses away any urine and blood. ? Fill the squirt bottle with clean water every time you use the bathroom.  It is normal to have vaginal bleeding after delivery. Wear a sanitary pad for vaginal bleeding and discharge. Summary  Vaginal delivery means that you will give birth by pushing your baby out of your birth canal (vagina).  Your health care provider may monitor your contractions (uterine monitoring) and your baby's heart rate (fetal monitoring).  Your health care provider may perform a physical exam.  Normal labor and delivery is divided  into three stages.  After labor is over, you and your baby will be monitored closely until you are ready to go home. This information is not intended to replace advice given to you by your health care provider. Make sure you discuss any questions you have with your health care provider. Document Revised: 05/08/2017 Document Reviewed: 05/08/2017 Elsevier Patient Education  2020 [employer]   Pain Relief During Labor and Delivery Many things can cause pain during labor and delivery, including:  Pressure on bones and ligaments due to the baby moving through the pelvis.  Stretching of tissues due to the baby moving through the birth canal.  Muscle tension due to anxiety or nervousness.  The uterus tightening (contracting) and relaxing to help move the baby. There are  many ways to deal with the pain of labor and delivery. They include:  Taking prenatal classes. Taking these classes helps you know what to expect during your baby's birth. What you learn will increase your confidence and decrease your anxiety.  Practicing relaxation techniques or doing relaxing activities, such as: ? Focused breathing. ? Meditation. ? Visualization. ? Aroma therapy. ? Listening to your favorite music. ? Hypnosis.  Taking a warm shower or bath (hydrotherapy). This may: ? Provide comfort and relaxation. ? Lessen your perception of pain. ? Decrease the amount of pain medicine needed. ? Decrease the length of labor.  Getting a massage or counterpressure on your back.  Applying warm packs or ice packs.  Changing positions often, moving around, or using a birthing ball.  Getting: ? Pain medicine through an IV or injection into a muscle. ? Pain medicine inserted into your spinal column. ? Injections of sterile water just under the skin on your lower back (intradermal injections). ? Laughing gas (nitrous oxide). Discuss your pain control options with your health care provider during your prenatal  visits. Explore the options offered by your hospital or birth center. What kinds of medicine are available? There are two kinds of medicines that can be used to relieve pain during labor and delivery:  Analgesics. These medicines decrease pain without causing you to lose feeling or the ability to move your muscles.  Anesthetics. These medicines block feeling in the body and can decrease your ability to move freely. Both of these kinds of medicine can cause minor side effects, such as nausea, trouble concentrating, and sleepiness. They can also decrease the baby's heart rate before birth and affect the baby's breathing rate after birth. For this reason, health care providers are careful about when and how much medicine is given. What are specific medicines and procedures that provide pain relief? Local Anesthetics Local anesthetics are used to numb a small area of the body. They may be used along with another kind of anesthetic or used to numb the nerves of the vagina, cervix, and perineum during the second stage of labor. General Anesthetics General anesthetics cause you to lose consciousness so you do not feel pain. They are usually only used for an emergency cesarean delivery. General anesthetics are given through an IV tube and a mask. Pudendal Block A pudendal block is a form of local anesthetic. It may be used to relieve the pain associated with pushing or stretching of the perineum at the time of delivery or to further numb the perineum. A pudendal block is done by injecting numbing medicine through the vaginal wall into a nerve in the pelvis. Epidural Analgesia Epidural analgesia is given through a flexible IV catheter that is inserted into the lower back. Numbing medicine is delivered continuously to the area near your spinal column nerves (epidural space). After having this type of analgesia, you may be able to move your legs but you most likely will not be able to walk. Depending on the  amount of medicine given, you may lose all feeling in the lower half of your body, or you may retain some level of sensation, including the urge to push. Epidural analgesia can be used to provide pain relief for a vaginal birth. Spinal Block A spinal block is similar to epidural analgesia, but the medicine is injected into the spinal fluid instead of the epidural space. A spinal block is only given once. It starts to relieve pain quickly, but the pain relief lasts only 1-6  hours. Spinal blocks can be used for cesarean deliveries. Combined Spinal-Epidural (CSE) Block A CSE block combines the effects of a spinal block and epidural analgesia. The spinal block works quickly to block all pain. The epidural analgesia provides continuous pain relief, even after the effects of the spinal block have worn off. This information is not intended to replace advice given to you by your health care provider. Make sure you discuss any questions you have with your health care provider. Document Revised: 03/16/2017 Document Reviewed: 08/25/2015 Elsevier Patient Education  [employer].   WHAT OB PATIENTS CAN EXPECT   Confirmation of pregnancy and ultrasound ordered if medically indicated-[redacted] weeks gestation  New OB (NOB) intake with nurse and New OB (NOB) labs- [redacted] weeks gestation  New OB (NOB) physical examination with provider- 11/[redacted] weeks gestation  Flu vaccine-[redacted] weeks gestation  Anatomy scan-[redacted] weeks gestation  Glucose tolerance test, blood work to test for anemia, T-dap vaccine-[redacted] weeks gestation  Vaginal swabs/cultures-STD/Group B strep-[redacted] weeks gestation  Appointments every 4 weeks until 28 weeks  Every 2 weeks from 28 weeks until 36 weeks  Weekly visits from 53 weeks until delivery    [facility]  [place], [place], [place] [address]  Phone: [phone]   [facility] (second location)  [address] [place], [place]  [address]  Phone: [phone]   [facility]) [place], [place], [place] [address] Phone: [phone]   [facility]  [address], [place], [place] [address]  Phone: [phone]  Contraception Choices Contraception, also called birth control, means things to use or ways to try not to get pregnant. Hormonal birth control This kind of birth control uses hormones. Here are some types of hormonal birth control:  A tube that is put under skin of the arm (implant). The tube can stay in for as long as 3 years.  Shots to get every 3 months (injections).  Pills to take every day (birth control pills).  A patch to change 1 time each week for 3 weeks (birth control patch). After that, the patch is taken off for 1 week.  A ring to put in the vagina. The ring is left in for 3 weeks. Then it is taken out of the vagina for 1 week. Then a new ring is put in.  Pills to take after unprotected sex (emergency birth control pills). Barrier birth control Here are some types of barrier birth control:  A thin covering that is put on the penis before sex (female condom). The covering is thrown away after sex.  A soft, loose covering that is put in the vagina before sex (female condom). The covering is thrown away after sex.  A rubber bowl that sits over the cervix (diaphragm). The bowl must be made for you. The bowl is put into the vagina before sex. The bowl is left in for 6-8 hours after sex. It is taken out within 24 hours.  A small, soft cup that fits over the cervix (cervical cap). The cup must be made for you. The cup can be left in for 6-8 hours after sex. It is taken out within 48 hours.  A sponge that is put into the vagina before sex. It must be left in for at least 6 hours after sex. It must be taken out within 30 hours. Then it is thrown away.  A chemical that kills or stops sperm from getting into  the uterus (spermicide). It may be a pill, cream,  jelly, or foam to put in the vagina. The chemical should be used at least 10-15 minutes before sex. IUD (intrauterine) birth control An IUD is a small, T-shaped piece of plastic. It is put inside the uterus. There are two kinds:  Hormone IUD. This kind can stay in for 3-5 years.  Copper IUD. This kind can stay in for 10 years. Permanent birth control Here are some types of permanent birth control:  Surgery to block the fallopian tubes.  Having an insert put into each fallopian tube.  Surgery to tie off the tubes that carry sperm (vasectomy). Natural planning birth control Here are some types of natural planning birth control:  Not having sex on the days the woman could get pregnant.  Using a calendar: ? To keep track of the length of each period. ? To find out what days pregnancy can happen. ? To plan to not have sex on days when pregnancy can happen.  Watching for symptoms of ovulation and not having sex during ovulation. One way the woman can check for ovulation is to check her temperature.  Waiting to have sex until after ovulation. Summary  Contraception, also called birth control, means things to use or ways to try not to get pregnant.  Hormonal methods of birth control include implants, injections, pills, patches, vaginal rings, and emergency birth control pills.  Barrier methods of birth control can include female condoms, female condoms, diaphragms, cervical caps, sponges, and spermicides.  There are two types of IUD (intrauterine device) birth control. An IUD can be put in a woman's uterus to prevent pregnancy for 3-5 years.  Permanent sterilization can be done through a procedure for males, females, or both.  Natural planning methods involve not having sex on the days when the woman could get pregnant. This information is not intended to replace advice given to you by your health care provider. Make sure you discuss any questions you have with your health care  provider. Document Revised: 07/24/2018 Document Reviewed: 04/13/2016 Elsevier Patient Education  2020 [employer].

## 2019-06-27 NOTE — Progress Notes (Signed)
ROB-No complaints.  

## 2019-06-28 LAB — CBC
Hematocrit: 39.6 % (ref 34.0–46.6)
Hemoglobin: 13 g/dL (ref 11.1–15.9)
MCH: 33.9 pg — ABNORMAL HIGH (ref 26.6–33.0)
MCHC: 32.8 g/dL (ref 31.5–35.7)
MCV: 103 fL — ABNORMAL HIGH (ref 79–97)
Platelets: 223 10*3/uL (ref 150–450)
RBC: 3.84 x10E6/uL (ref 3.77–5.28)
RDW: 12.1 % (ref 11.7–15.4)
WBC: 7.1 10*3/uL (ref 3.4–10.8)

## 2019-06-28 LAB — RPR: RPR Ser Ql: NONREACTIVE

## 2019-06-28 LAB — GLUCOSE, 1 HOUR GESTATIONAL: Gestational Diabetes Screen: 150 mg/dL — ABNORMAL HIGH (ref 65–139)

## 2019-06-28 NOTE — Progress Notes (Signed)
ROB-Doing well. 28 week labs today, see orders. TDaP given. Blood transfusion consent reviewed and signed. Third trimester handouts provided. Plans NCB, breastfeeding, NFP, and uses [facility]. Discussed COVID restrictions and option for waterbirth at [facility]. Encouraged pelvic floor physical therapy in the third trimester. Anticipatory guidance regarding course of prenatal care. Reviewed red flag symptoms and when to call. RTC x 2 weeks for ROB or sooner if needed.

## 2019-07-01 ENCOUNTER — Other Ambulatory Visit: Payer: Self-pay

## 2019-07-01 ENCOUNTER — Other Ambulatory Visit: Payer: 59

## 2019-07-01 DIAGNOSIS — R7309 Other abnormal glucose: Secondary | ICD-10-CM

## 2019-07-01 DIAGNOSIS — Z131 Encounter for screening for diabetes mellitus: Secondary | ICD-10-CM

## 2019-07-01 DIAGNOSIS — Z3493 Encounter for supervision of normal pregnancy, unspecified, third trimester: Secondary | ICD-10-CM

## 2019-07-02 LAB — GESTATIONAL GLUCOSE TOLERANCE
Glucose, Fasting: 74 mg/dL (ref 65–94)
Glucose, GTT - 1 Hour: 178 mg/dL (ref 65–179)
Glucose, GTT - 2 Hour: 164 mg/dL — ABNORMAL HIGH (ref 65–154)
Glucose, GTT - 3 Hour: 128 mg/dL (ref 65–139)

## 2019-07-08 ENCOUNTER — Other Ambulatory Visit: Payer: 59

## 2019-07-08 ENCOUNTER — Ambulatory Visit (INDEPENDENT_AMBULATORY_CARE_PROVIDER_SITE_OTHER): Payer: 59 | Admitting: Certified Nurse Midwife

## 2019-07-08 ENCOUNTER — Encounter: Payer: Self-pay | Admitting: Certified Nurse Midwife

## 2019-07-08 ENCOUNTER — Other Ambulatory Visit: Payer: Self-pay

## 2019-07-08 VITALS — BP 106/59 | HR 76 | Wt 154.2 lb

## 2019-07-08 DIAGNOSIS — Z3493 Encounter for supervision of normal pregnancy, unspecified, third trimester: Secondary | ICD-10-CM

## 2019-07-08 LAB — POCT URINALYSIS DIPSTICK OB
Bilirubin, UA: NEGATIVE
Blood, UA: NEGATIVE
Glucose, UA: NEGATIVE
Ketones, UA: NEGATIVE
Leukocytes, UA: NEGATIVE
Nitrite, UA: NEGATIVE
POC,PROTEIN,UA: NEGATIVE
Spec Grav, UA: 1.015 (ref 1.010–1.025)
Urobilinogen, UA: 0.2 E.U./dL
pH, UA: 5 (ref 5.0–8.0)

## 2019-07-08 NOTE — Progress Notes (Signed)
ROB doing well. Feels good movement. Discussed birth plan. Pt is considering water birth but has concerns because she deliverd so quickly with her last baby she is worried that she may not be able to use tub. She plans natural birth and has reached out to volunteer doula. She is going to breastfeeding and is using natural family planning for [facility].She is Seeing pelvic floor therapist in [place].  Follow up 2 wks with Marcelino Duster.   [name], CNM

## 2019-07-08 NOTE — Patient Instructions (Signed)
[facility]  [facility]  [address], [place], [place] [address]  Phone: [phone]  [facility] (second location)  [address] [place], [place] [address]  Phone: [phone]  [facility] ([place]) [address], [place], [place] [address] Phone: [phone]  [facility]  [address] [place], [place] [address]  Phone: [phone] 

## 2019-07-14 ENCOUNTER — Ambulatory Visit: Payer: 59 | Attending: Internal Medicine

## 2019-07-21 ENCOUNTER — Other Ambulatory Visit: Payer: Self-pay

## 2019-07-21 ENCOUNTER — Ambulatory Visit (INDEPENDENT_AMBULATORY_CARE_PROVIDER_SITE_OTHER): Payer: 59 | Admitting: Certified Nurse Midwife

## 2019-07-21 ENCOUNTER — Encounter: Payer: Self-pay | Admitting: Certified Nurse Midwife

## 2019-07-21 VITALS — BP 102/68 | HR 85 | Wt 154.0 lb

## 2019-07-21 DIAGNOSIS — Z3493 Encounter for supervision of normal pregnancy, unspecified, third trimester: Secondary | ICD-10-CM

## 2019-07-21 DIAGNOSIS — Z3A31 31 weeks gestation of pregnancy: Secondary | ICD-10-CM

## 2019-07-21 LAB — POCT URINALYSIS DIPSTICK OB
Bilirubin, UA: NEGATIVE
Blood, UA: NEGATIVE
Glucose, UA: NEGATIVE
Ketones, UA: NEGATIVE
Leukocytes, UA: NEGATIVE
Nitrite, UA: NEGATIVE
POC,PROTEIN,UA: NEGATIVE
Spec Grav, UA: 1.015 (ref 1.010–1.025)
Urobilinogen, UA: 0.2 U/dL
pH, UA: 7.5 (ref 5.0–8.0)

## 2019-07-21 NOTE — Progress Notes (Signed)
ROB-Pt present for routine prenatal care. Pt stated that she is doing well.  

## 2019-07-21 NOTE — Progress Notes (Signed)
ROB-Doing well. Completed waterbirth class in May. Feeling fine after Modern[employer] COVID vaccine. Anticipatory guidance regarding course of prenatal care. Reviewed red flag symptoms and when to call. RTC x 2 weeks for ROB or sooner if needed.

## 2019-07-21 NOTE — Patient Instructions (Signed)
Third Trimester of Pregnancy  The third trimester is from week 28 through week 40 (months 7 through 9). This trimester is when your unborn baby (fetus) is growing very fast. At the end of the ninth month, the unborn baby is about 20 inches in length. It weighs about 6-10 pounds. Follow these instructions at home: Medicines  Take over-the-counter and prescription medicines only as told by your doctor. Some medicines are safe and some medicines are not safe during pregnancy.  Take a prenatal vitamin that contains at least 600 micrograms (mcg) of folic acid.  If you have trouble pooping (constipation), take medicine that will make your stool soft (stool softener) if your doctor approves. Eating and drinking   Eat regular, healthy meals.  Avoid raw meat and uncooked cheese.  If you get low calcium from the food you eat, talk to your doctor about taking a daily calcium supplement.  Eat four or five small meals rather than three large meals a day.  Avoid foods that are high in fat and sugars, such as fried and sweet foods.  To prevent constipation: ? Eat foods that are high in fiber, like fresh fruits and vegetables, whole grains, and beans. ? Drink enough fluids to keep your pee (urine) clear or pale yellow. Activity  Exercise only as told by your doctor. Stop exercising if you start to have cramps.  Avoid heavy lifting, wear low heels, and sit up straight.  Do not exercise if it is too hot, too humid, or if you are in a place of great height (high altitude).  You may continue to have sex unless your doctor tells you not to. Relieving pain and discomfort  Wear a good support bra if your breasts are tender.  Take frequent breaks and rest with your legs raised if you have leg cramps or low back pain.  Take warm water baths (sitz baths) to soothe pain or discomfort caused by hemorrhoids. Use hemorrhoid cream if your doctor approves.  If you develop puffy, bulging veins (varicose  veins) in your legs: ? Wear support hose or compression stockings as told by your doctor. ? Raise (elevate) your feet for 15 minutes, 3-4 times a day. ? Limit salt in your food. Safety  Wear your seat belt when driving.  Make a list of emergency phone numbers, including numbers for family, friends, the hospital, and police and fire departments. Preparing for your baby's arrival To prepare for the arrival of your baby:  Take prenatal classes.  Practice driving to the hospital.  Visit the hospital and tour the maternity area.  Talk to your work about taking leave once the baby comes.  Pack your hospital bag.  Prepare the baby's room.  Go to your doctor visits.  Buy a rear-facing car seat. Learn how to install it in your car. General instructions  Do not use hot tubs, steam rooms, or saunas.  Do not use any products that contain nicotine or tobacco, such as cigarettes and e-cigarettes. If you need help quitting, ask your doctor.  Do not drink alcohol.  Do not douche or use tampons or scented sanitary pads.  Do not cross your legs for long periods of time.  Do not travel for long distances unless you must. Only do so if your doctor says it is okay.  Visit your dentist if you have not gone during your pregnancy. Use a soft toothbrush to brush your teeth. Be gentle when you floss.  Avoid cat litter boxes and soil   used by cats. These carry germs that can cause birth defects in the baby and can cause a loss of your baby (miscarriage) or stillbirth.  Keep all your prenatal visits as told by your doctor. This is important. Contact a doctor if:  You are not sure if you are in labor or if your water has broken.  You are dizzy.  You have mild cramps or pressure in your lower belly.  You have a nagging pain in your belly area.  You continue to feel sick to your stomach, you throw up, or you have watery poop.  You have bad smelling fluid coming from your vagina.  You have  pain when you pee. Get help right away if:  You have a fever.  You are leaking fluid from your vagina.  You are spotting or bleeding from your vagina.  You have severe belly cramps or pain.  You lose or gain weight quickly.  You have trouble catching your breath and have chest pain.  You notice sudden or extreme puffiness (swelling) of your face, hands, ankles, feet, or legs.  You have not felt the baby move in over an hour.  You have severe headaches that do not go away with medicine.  You have trouble seeing.  You are leaking, or you are having a gush of fluid, from your vagina before you are 37 weeks.  You have regular belly spasms (contractions) before you are 37 weeks. Summary  The third trimester is from week 28 through week 40 (months 7 through 9). This time is when your unborn baby is growing very fast.  Follow your doctor's advice about medicine, food, and activity.  Get ready for the arrival of your baby by taking prenatal classes, getting all the baby items ready, preparing the baby's room, and visiting your doctor to be checked.  Get help right away if you are bleeding from your vagina, or you have chest pain and trouble catching your breath, or if you have not felt your baby move in over an hour. This information is not intended to replace advice given to you by your health care provider. Make sure you discuss any questions you have with your health care provider. Document Revised: 07/25/2018 Document Reviewed: 05/09/2016 Elsevier Patient Education  2020 Elsevier Inc.   Fetal Movement Counts Patient Name: ________________________________________________ Patient Due Date: ____________________ What is a fetal movement count?  A fetal movement count is the number of times that you feel your baby move during a certain amount of time. This may also be called a fetal kick count. A fetal movement count is recommended for every pregnant woman. You may be asked to  start counting fetal movements as early as week 28 of your pregnancy. Pay attention to when your baby is most active. You may notice your baby's sleep and wake cycles. You may also notice things that make your baby move more. You should do a fetal movement count:  When your baby is normally most active.  At the same time each day. A good time to count movements is while you are resting, after having something to eat and drink. How do I count fetal movements? 1. Find a quiet, comfortable area. Sit, or lie down on your side. 2. Write down the date, the start time and stop time, and the number of movements that you felt between those two times. Take this information with you to your health care visits. 3. Write down your start time when you feel   the first movement. 4. Count kicks, flutters, swishes, rolls, and jabs. You should feel at least 10 movements. 5. You may stop counting after you have felt 10 movements, or if you have been counting for 2 hours. Write down the stop time. 6. If you do not feel 10 movements in 2 hours, contact your health care provider for further instructions. Your health care provider may want to do additional tests to assess your baby's well-being. Contact a health care provider if:  You feel fewer than 10 movements in 2 hours.  Your baby is not moving like he or she usually does. Date: ____________ Start time: ____________ Stop time: ____________ Movements: ____________ Date: ____________ Start time: ____________ Stop time: ____________ Movements: ____________ Date: ____________ Start time: ____________ Stop time: ____________ Movements: ____________ Date: ____________ Start time: ____________ Stop time: ____________ Movements: ____________ Date: ____________ Start time: ____________ Stop time: ____________ Movements: ____________ Date: ____________ Start time: ____________ Stop time: ____________ Movements: ____________ Date: ____________ Start time: ____________  Stop time: ____________ Movements: ____________ Date: ____________ Start time: ____________ Stop time: ____________ Movements: ____________ Date: ____________ Start time: ____________ Stop time: ____________ Movements: ____________ This information is not intended to replace advice given to you by your health care provider. Make sure you discuss any questions you have with your health care provider. Document Revised: 11/21/2018 Document Reviewed: 11/21/2018 Elsevier Patient Education  2020 Elsevier Inc.  

## 2019-07-28 ENCOUNTER — Encounter: Payer: Self-pay | Admitting: Certified Nurse Midwife

## 2019-07-28 ENCOUNTER — Telehealth: Payer: Self-pay | Admitting: Certified Nurse Midwife

## 2019-07-28 ENCOUNTER — Other Ambulatory Visit: Payer: Self-pay

## 2019-07-28 ENCOUNTER — Ambulatory Visit (INDEPENDENT_AMBULATORY_CARE_PROVIDER_SITE_OTHER): Payer: 59 | Admitting: Certified Nurse Midwife

## 2019-07-28 VITALS — BP 115/80 | HR 88 | Wt 152.5 lb

## 2019-07-28 DIAGNOSIS — Z3493 Encounter for supervision of normal pregnancy, unspecified, third trimester: Secondary | ICD-10-CM | POA: Diagnosis not present

## 2019-07-28 DIAGNOSIS — Z3A33 33 weeks gestation of pregnancy: Secondary | ICD-10-CM | POA: Diagnosis not present

## 2019-07-28 LAB — POCT URINALYSIS DIPSTICK OB
Bilirubin, UA: NEGATIVE
Blood, UA: NEGATIVE
Glucose, UA: NEGATIVE
Ketones, UA: NEGATIVE
Nitrite, UA: NEGATIVE
POC,PROTEIN,UA: NEGATIVE
Spec Grav, UA: 1.015 (ref 1.010–1.025)
Urobilinogen, UA: 0.2 E.U./dL
pH, UA: 7 (ref 5.0–8.0)

## 2019-07-28 NOTE — Patient Instructions (Signed)
Heartburn During Pregnancy  Heartburn is pain or discomfort in the throat or chest. It may cause a burning feeling. It happens when stomach acid moves up into the tube that carries food from your mouth to your stomach (esophagus). Heartburn is common during pregnancy. It usually goes away or gets better after giving birth. Follow these instructions at home: Eating and drinking  Do not drink alcohol while you are pregnant.  Figure out which foods and beverages make you feel worse, and avoid them.  Beverages that you may want to avoid include: ? Coffee and tea (with or without caffeine). ? Energy drinks and sports drinks. ? Bubbly (carbonated) drinks or sodas. ? Citrus fruit juices.  Foods that you may want to avoid include: ? Chocolate and cocoa. ? Peppermint and mint flavorings. ? Garlic, onions, and horseradish. ? Spicy and acidic foods. These include peppers, chili powder, curry powder, vinegar, hot sauces, and barbecue sauce. ? Citrus fruits, such as oranges, lemons, and limes. ? Tomato-based foods, such as red sauce, chili, and salsa. ? Fried and fatty foods, such as donuts, french fries, potato chips, and high-fat dressings. ? High-fat meats, such as hot dogs, cold cuts, sausage, ham, and bacon. ? High-fat dairy items, such as whole milk, butter, and cheese.  Eat small meals often, instead of large meals.  Avoid drinking a lot of liquid with your meals.  Avoid eating meals during the 2-3 hours before you go to bed.  Avoid lying down right after you eat.  Do not exercise right after you eat. Medicines  Take over-the-counter and prescription medicines only as told by your doctor.  Do not take aspirin, ibuprofen, or other NSAIDs unless your doctor tells you to do that.  Your doctor may tell you to avoid medicines that have sodium bicarbonate in them. General instructions   If told, raise the head of your bed about 6 inches (15 cm). You can do this by putting blocks  under the legs. Sleeping with more pillows does not help with heartburn.  Do not use any products that contain nicotine or tobacco, such as cigarettes and e-cigarettes. If you need help quitting, ask your doctor.  Wear loose-fitting clothing.  Try to lower your stress, such as with yoga or meditation. If you need help, ask your doctor.  Stay at a healthy weight. If you are overweight, work with your doctor to safely lose weight.  Keep all follow-up visits as told by your doctor. This is important. Contact a doctor if:  You get new symptoms.  Your symptoms do not get better with treatment.  You have weight loss and you do not know why.  You have trouble swallowing.  You make loud sounds when you breathe (wheeze).  You have a cough that does not go away.  You have heartburn often for more than 2 weeks.  You feel sick to your stomach (nauseous), and this does not get better with treatment.  You are throwing up (vomiting), and this does not get better with treatment.  You have pain in your belly (abdomen). Get help right away if:  You have very bad chest pain that spreads to your arm, neck, or jaw.  You feel sweaty, dizzy, or light-headed.  You have trouble breathing.  You have pain when swallowing.  You throw up and your throw-up looks like blood or coffee grounds.  Your poop (stool) is bloody or black. This information is not intended to replace advice given to you by your health   care provider. Make sure you discuss any questions you have with your health care provider. Document Revised: 07/25/2018 Document Reviewed: 12/20/2015 Elsevier Patient Education  2020 Elsevier Inc.  

## 2019-07-28 NOTE — Telephone Encounter (Signed)
Patient was having intense pain in her chest where her rib cage meets her belly  that caused her to just lay there and moan. Patient said this lasted a couple hours. Patient waited it out nothing seemed to help.   Patient would like a call back to see if this is normal or if she would need to be seen.

## 2019-07-28 NOTE — Telephone Encounter (Signed)
Please add patient to schedule today. Thanks, JML

## 2019-07-28 NOTE — Progress Notes (Signed)
ROB-Pt present due to having acid reflux pain in the middle of her chest. No other issues.

## 2019-07-30 ENCOUNTER — Other Ambulatory Visit (INDEPENDENT_AMBULATORY_CARE_PROVIDER_SITE_OTHER): Payer: 59 | Admitting: Certified Nurse Midwife

## 2019-07-30 DIAGNOSIS — Z3A33 33 weeks gestation of pregnancy: Secondary | ICD-10-CM

## 2019-07-30 DIAGNOSIS — O99619 Diseases of the digestive system complicating pregnancy, unspecified trimester: Secondary | ICD-10-CM

## 2019-07-30 DIAGNOSIS — Z3493 Encounter for supervision of normal pregnancy, unspecified, third trimester: Secondary | ICD-10-CM

## 2019-07-30 DIAGNOSIS — O26813 Pregnancy related exhaustion and fatigue, third trimester: Secondary | ICD-10-CM

## 2019-07-30 DIAGNOSIS — K219 Gastro-esophageal reflux disease without esophagitis: Secondary | ICD-10-CM | POA: Insufficient documentation

## 2019-07-30 NOTE — Telephone Encounter (Signed)
Please contact patient to schedule lab appointment. Thanks, JML

## 2019-07-30 NOTE — Progress Notes (Signed)
Orders for CBC and CMP due to fatigue and reflux, see chart.    Gunnar Bulla, CNM [facility], [facility] 07/30/19 1:07 PM

## 2019-08-01 ENCOUNTER — Other Ambulatory Visit: Payer: 59

## 2019-08-01 ENCOUNTER — Other Ambulatory Visit: Payer: Self-pay

## 2019-08-01 DIAGNOSIS — O26813 Pregnancy related exhaustion and fatigue, third trimester: Secondary | ICD-10-CM

## 2019-08-01 DIAGNOSIS — Z3493 Encounter for supervision of normal pregnancy, unspecified, third trimester: Secondary | ICD-10-CM

## 2019-08-01 DIAGNOSIS — Z3A33 33 weeks gestation of pregnancy: Secondary | ICD-10-CM

## 2019-08-02 LAB — CBC
Hematocrit: 39.4 % (ref 34.0–46.6)
Hemoglobin: 14 g/dL (ref 11.1–15.9)
MCH: 35 pg — ABNORMAL HIGH (ref 26.6–33.0)
MCHC: 35.5 g/dL (ref 31.5–35.7)
MCV: 99 fL — ABNORMAL HIGH (ref 79–97)
Platelets: 214 10*3/uL (ref 150–450)
RBC: 4 x10E6/uL (ref 3.77–5.28)
RDW: 12 % (ref 11.7–15.4)
WBC: 7.6 10*3/uL (ref 3.4–10.8)

## 2019-08-02 LAB — COMPREHENSIVE METABOLIC PANEL
ALT: 6 IU/L (ref 0–32)
AST: 14 IU/L (ref 0–40)
Albumin/Globulin Ratio: 1.5 (ref 1.2–2.2)
Albumin: 3.4 g/dL — ABNORMAL LOW (ref 3.8–4.8)
Alkaline Phosphatase: 92 IU/L (ref 39–117)
BUN/Creatinine Ratio: 12 (ref 9–23)
BUN: 6 mg/dL (ref 6–20)
Bilirubin Total: <0.2 mg/dL (ref 0.0–1.2)
CO2: 23 mmol/L (ref 20–29)
Calcium: 8.5 mg/dL — ABNORMAL LOW (ref 8.7–10.2)
Chloride: 104 mmol/L (ref 96–106)
Creatinine, Ser: 0.51 mg/dL — ABNORMAL LOW (ref 0.57–1.00)
GFR calc Af Amer: 147 mL/min/{1.73_m2} (ref >59)
GFR calc non Af Amer: 128 mL/min/{1.73_m2} (ref >59)
Globulin, Total: 2.2 g/dL (ref 1.5–4.5)
Glucose: 86 mg/dL (ref 65–99)
Potassium: 4.2 mmol/L (ref 3.5–5.2)
Sodium: 141 mmol/L (ref 134–144)
Total Protein: 5.6 g/dL — ABNORMAL LOW (ref 6.0–8.5)

## 2019-08-02 NOTE — Progress Notes (Signed)
Subjective:   Tammy Barton is a 34 y.o. G2P1001 [redacted]w[redacted]d being seen today for work in problem prenatal visit.   Patient reports single episode of central upper stomach pain/pressure last two (2) hours, requiring hypnobirthing track usage to cope.    No contractions, vaginal bleeding or leaking of fluid.  Reports good fetal movement.  Denies difficulty breathing or respiratory distress, chest pain, dysuria, and leg pain or swelling.   The following portions of the patient's history were reviewed and updated as appropriate: allergies, current medications, past family history, past medical history, past social history, past surgical history and problem list.   Objective:   BP 115/80   Pulse 88   Wt 152 lb 8 oz (69.2 kg)   LMP 12/08/2018 (Exact Date)   BMI 25.38 kg/m   FHT: Fetal Heart Rate (bpm): 140  Fetal Movement: Movement: Present    Abdomen:  soft, gravid, appropriate for gestational age,non-tender   No results found for this or any previous visit (from the past 24 hour(s)).  Assessment:   Pregnancy:  G2P1001 at [redacted]w[redacted]d  1. Third trimester pregnancy   2. [redacted] weeks gestation of pregnancy  - POC Urinalysis Dipstick OB  Plan:   Discussed home treatment measures for reflux and symptoms tracking.   Preterm labor symptoms: vaginal bleeding, contractions and leaking of fluid reviewed in detail.  Fetal movement precautions reviewed.  Follow up as previously scheduled or sooner if needed.   Gunnar Bulla, CNM [facility], [facility]

## 2019-08-04 ENCOUNTER — Ambulatory Visit (INDEPENDENT_AMBULATORY_CARE_PROVIDER_SITE_OTHER): Payer: 59 | Admitting: Certified Nurse Midwife

## 2019-08-04 ENCOUNTER — Encounter: Payer: Self-pay | Admitting: Certified Nurse Midwife

## 2019-08-04 ENCOUNTER — Other Ambulatory Visit: Payer: Self-pay

## 2019-08-04 VITALS — BP 100/67 | HR 74 | Wt 154.0 lb

## 2019-08-04 DIAGNOSIS — Z3A34 34 weeks gestation of pregnancy: Secondary | ICD-10-CM

## 2019-08-04 LAB — POCT URINALYSIS DIPSTICK OB
Bilirubin, UA: NEGATIVE
Blood, UA: NEGATIVE
Glucose, UA: NEGATIVE
Ketones, UA: NEGATIVE
Leukocytes, UA: NEGATIVE
Nitrite, UA: NEGATIVE
POC,PROTEIN,UA: NEGATIVE
Spec Grav, UA: 1.025 (ref 1.010–1.025)
Urobilinogen, UA: 0.2 E.U./dL
pH, UA: 5 (ref 5.0–8.0)

## 2019-08-04 NOTE — Patient Instructions (Signed)
Braxton Hicks Contractions °Contractions of the uterus can occur throughout pregnancy, but they are not always a sign that you are in labor. You may have practice contractions called Braxton Hicks contractions. These false labor contractions are sometimes confused with true labor. °What are Braxton Hicks contractions? °Braxton Hicks contractions are tightening movements that occur in the muscles of the uterus before labor. Unlike true labor contractions, these contractions do not result in opening (dilation) and thinning of the cervix. Toward the end of pregnancy (32-34 weeks), Braxton Hicks contractions can happen more often and may become stronger. These contractions are sometimes difficult to tell apart from true labor because they can be very uncomfortable. You should not feel embarrassed if you go to the hospital with false labor. °Sometimes, the only way to tell if you are in true labor is for your health care provider to look for changes in the cervix. The health care provider will do a physical exam and may monitor your contractions. If you are not in true labor, the exam should show that your cervix is not dilating and your water has not broken. °If there are no other health problems associated with your pregnancy, it is completely safe for you to be sent home with false labor. You may continue to have Braxton Hicks contractions until you go into true labor. °How to tell the difference between true labor and false labor °True labor °· Contractions last 30-70 seconds. °· Contractions become very regular. °· Discomfort is usually felt in the top of the uterus, and it spreads to the lower abdomen and low back. °· Contractions do not go away with walking. °· Contractions usually become more intense and increase in frequency. °· The cervix dilates and gets thinner. °False labor °· Contractions are usually shorter and not as strong as true labor contractions. °· Contractions are usually irregular. °· Contractions  are often felt in the front of the lower abdomen and in the groin. °· Contractions may go away when you walk around or change positions while lying down. °· Contractions get weaker and are shorter-lasting as time goes on. °· The cervix usually does not dilate or become thin. °Follow these instructions at home: ° °· Take over-the-counter and prescription medicines only as told by your health care provider. °· Keep up with your usual exercises and follow other instructions from your health care provider. °· Eat and drink lightly if you think you are going into labor. °· If Braxton Hicks contractions are making you uncomfortable: °? Change your position from lying down or resting to walking, or change from walking to resting. °? Sit and rest in a tub of warm water. °? Drink enough fluid to keep your urine pale yellow. Dehydration may cause these contractions. °? Do slow and deep breathing several times an hour. °· Keep all follow-up prenatal visits as told by your health care provider. This is important. °Contact a health care provider if: °· You have a fever. °· You have continuous pain in your abdomen. °Get help right away if: °· Your contractions become stronger, more regular, and closer together. °· You have fluid leaking or gushing from your vagina. °· You pass blood-tinged mucus (bloody show). °· You have bleeding from your vagina. °· You have low back pain that you never had before. °· You feel your baby’s head pushing down and causing pelvic pressure. °· Your baby is not moving inside you as much as it used to. °Summary °· Contractions that occur before labor are   called Braxton Hicks contractions, false labor, or practice contractions. °· Braxton Hicks contractions are usually shorter, weaker, farther apart, and less regular than true labor contractions. True labor contractions usually become progressively stronger and regular, and they become more frequent. °· Manage discomfort from Braxton Hicks contractions  by changing position, resting in a warm bath, drinking plenty of water, or practicing deep breathing. °This information is not intended to replace advice given to you by your health care provider. Make sure you discuss any questions you have with your health care provider. °Document Revised: 03/16/2017 Document Reviewed: 08/17/2016 °Elsevier Patient Education © 2020 Elsevier Inc. ° °

## 2019-08-04 NOTE — Progress Notes (Signed)
ROB doing well. Feels good movement. Has not had any reoccurrence of abdominal pain since last visit. She has a concern regarding fetal size and her total weight gain. She states that the prepregnancy weight was incorrect and state she has only gained 12-14 lbs. Measuring 33 wks. U/s ordered for growth and next visit. Discussed GBs and cultures next visit. She verbalizes and agrees to plan of care.   [name], CNM

## 2019-08-21 ENCOUNTER — Encounter: Payer: 59 | Admitting: Certified Nurse Midwife

## 2019-08-21 ENCOUNTER — Ambulatory Visit (INDEPENDENT_AMBULATORY_CARE_PROVIDER_SITE_OTHER): Payer: 59 | Admitting: Certified Nurse Midwife

## 2019-08-21 ENCOUNTER — Other Ambulatory Visit: Payer: 59

## 2019-08-21 ENCOUNTER — Other Ambulatory Visit: Payer: Self-pay

## 2019-08-21 ENCOUNTER — Ambulatory Visit (INDEPENDENT_AMBULATORY_CARE_PROVIDER_SITE_OTHER): Payer: 59

## 2019-08-21 VITALS — BP 119/88 | HR 99 | Wt 156.2 lb

## 2019-08-21 DIAGNOSIS — Z3685 Encounter for antenatal screening for Streptococcus B: Secondary | ICD-10-CM

## 2019-08-21 DIAGNOSIS — Z113 Encounter for screening for infections with a predominantly sexual mode of transmission: Secondary | ICD-10-CM

## 2019-08-21 DIAGNOSIS — O288 Other abnormal findings on antenatal screening of mother: Secondary | ICD-10-CM

## 2019-08-21 DIAGNOSIS — Z3A34 34 weeks gestation of pregnancy: Secondary | ICD-10-CM | POA: Diagnosis not present

## 2019-08-21 DIAGNOSIS — Z3A36 36 weeks gestation of pregnancy: Secondary | ICD-10-CM

## 2019-08-21 DIAGNOSIS — Z3493 Encounter for supervision of normal pregnancy, unspecified, third trimester: Secondary | ICD-10-CM

## 2019-08-21 LAB — POCT URINALYSIS DIPSTICK OB
Bilirubin, UA: NEGATIVE
Blood, UA: NEGATIVE
Glucose, UA: NEGATIVE
Ketones, UA: NEGATIVE
Leukocytes, UA: NEGATIVE
Nitrite, UA: NEGATIVE
POC,PROTEIN,UA: NEGATIVE
Spec Grav, UA: <=1.005 — AB (ref 1.010–1.025)
Urobilinogen, UA: 0.2 E.U./dL
pH, UA: 5 (ref 5.0–8.0)

## 2019-08-21 NOTE — Patient Instructions (Signed)
Oligohydramnios Oligohydramnios is a condition in which there is not enough fluid in the sac (amniotic sac) that surrounds your unborn baby (fetus) in the uterus. The amniotic sac contains fluid (amniotic fluid) that:  Protects your baby from injury (trauma) and infections.  Helps your baby move freely inside the uterus.  Helps your baby's lungs, kidneys, and digestive system to develop. This condition can interfere with your baby's normal prenatal growth and development. It can happen any time during pregnancy but is most common during the last 3 months (third trimester). Oligohydramnios is most likely to cause serious problems when it occurs early in pregnancy. Some problems that can result from this condition include:  Early (premature) birth.  Birth defects.  Limited (restricted) fetal growth.  Decreased oxygen flow to the fetus due to pressure on the umbilical cord.  Pregnancy loss (miscarriage).  Stillbirth. What are the causes? This condition may be caused by:  A leak or tear in the amniotic sac.  A problem with the organ that nourishes the baby in the uterus (placenta), such as failure of the placenta to provide enough blood, fluid, and nutrients to the baby.  Having identical twins who share the same placenta.  A fetal birth defect. This is usually an abnormality in the fetal kidneys or urinary tract.  A pregnancy that goes 2 weeks or more past the due date.  A condition that the mother has, such as: ? A lack of fluids in the body (dehydration). ? High blood pressure. ? Diabetes. ? Reactions to certain medicines, such as ibuprofen or blood pressure medicines (ACE inhibitors). ? Systemic lupus. In some cases, the cause is not known. What increases the risk? This condition is more likely to develop if you:  Become dehydrated.  Have high blood pressure.  Take NSAIDs or ACE inhibitors.  Have diabetes or lupus.  Have poor prenatal care.  Have a clotting  disorder. What are the signs or symptoms? In most cases, there are no symptoms of oligohydramnios. If you do have symptoms, they may include:  Fluid leaking from the vagina.  Having a uterus that is smaller than normal.  Feeling less movement of your baby in your uterus. How is this diagnosed? This condition may be diagnosed by measuring the amount of amniotic fluid in your amniotic sac using the amniotic fluid index (AFI). The AFI is a measurement that is done during a prenatal ultrasound test that uses painless, harmless sound waves to create an image of your uterus and your baby. This prenatal ultrasound may be done to:  Measure the amniotic fluid level.  Check your baby's kidneys and your baby's growth.  Evaluate the placenta. You may also have other tests to find the cause of oligohydramnios. How is this treated? Treatment for this condition depends on how low your amniotic fluid level is, how far along you are in your pregnancy, and your overall health. Treatment may include:  Having your health care provider monitor your condition more closely than usual. You may have more frequent appointments and more AFI ultrasound measurements.  Increasing the amount of fluid in your body. This may be done by having you drink more fluids or by giving you fluids through an IV that is inserted into one of your veins.  Injecting fluid into your amniotic sac during delivery (amnioinfusion).  Having your baby delivered early, if you are close to your due date. Follow these instructions at home: Lifestyle  Do not drink alcohol. No amount of alcohol is  safe during pregnancy.  Do not use any products that contain nicotine or tobacco, such as cigarettes, e-cigarettes, and chewing tobacco. If you need help quitting, ask your health care provider.  Do not use any illegal drugs. These can harm your developing baby or cause a miscarriage. General instructions      Take over-the-counter and  prescription medicines only as told by your health care provider.  Follow instructions from your health care provider about physical activity and rest. Your health care provider may recommend that you stay in bed (be on bed rest).  Follow instructions from your health care provider about eating or drinking restrictions.  Eat healthy foods, including a balance of fruits and vegetables, lean proteins, whole grains, and low-fat or nonfat dairy products.  Drink enough fluid to keep your urine pale yellow.  Keep all prenatal care appointments with your health care provider. This is important. Contact a health care provider if:  You notice that your baby seems to be moving less than usual. Get help right away if:  You have fluid leaking from your vagina.  You start to have labor pains (contractions). This may feel like a sense of tightening in your lower abdomen.  You have a fever. Summary  Oligohydramnios is a condition in which there is not enough fluid in the amniotic sac that surrounds your unborn baby in the uterus. It can interfere with your baby's normal growth and development.  This condition is most common in the last 3 months of pregnancy but can occur at any time. If it occurs early in pregnancy, oligohydramnios can lead to serious problems.  Do not drink alcohol or use nicotine, tobacco, or illegal drugs.  Keep your regular prenatal appointments, eat healthy, and drink enough fluid to keep your urine pale yellow.  Contact your health care provider if you notice that your baby seems to be moving less than usual. Get help right away if you have fluid leaking from your vagina, you sense a tightening in your lower abdomen, or you have a fever. This information is not intended to replace advice given to you by your health care provider. Make sure you discuss any questions you have with your health care provider. Document Revised: 09/12/2017 Document Reviewed: 09/12/2017 Elsevier  Patient Education  2020 [employer].   Fetal Movement Counts Patient Name: ________________________________________________ Patient Due Date: ____________________ What is a fetal movement count?  A fetal movement count is the number of times that you feel your baby move during a certain amount of time. This may also be called a fetal kick count. A fetal movement count is recommended for every pregnant woman. You may be asked to start counting fetal movements as early as week 28 of your pregnancy. Pay attention to when your baby is most active. You may notice your baby's sleep and wake cycles. You may also notice things that make your baby move more. You should do a fetal movement count:  When your baby is normally most active.  At the same time each day. A good time to count movements is while you are resting, after having something to eat and drink. How do I count fetal movements? 1. Find a quiet, comfortable area. Sit, or lie down on your side. 2. Write down the date, the start time and stop time, and the number of movements that you felt between those two times. Take this information with you to your health care visits. 3. Write down your start time when you  feel the first movement. 4. Count kicks, flutters, swishes, rolls, and jabs. You should feel at least 10 movements. 5. You may stop counting after you have felt 10 movements, or if you have been counting for 2 hours. Write down the stop time. 6. If you do not feel 10 movements in 2 hours, contact your health care provider for further instructions. Your health care provider may want to do additional tests to assess your baby's well-being. Contact a health care provider if:  You feel fewer than 10 movements in 2 hours.  Your baby is not moving like he or she usually does. Date: ____________ Start time: ____________ Stop time: ____________ Movements: ____________ Date: ____________ Start time: ____________ Stop time: ____________  Movements: ____________ Date: ____________ Start time: ____________ Stop time: ____________ Movements: ____________ Date: ____________ Start time: ____________ Stop time: ____________ Movements: ____________ Date: ____________ Start time: ____________ Stop time: ____________ Movements: ____________ Date: ____________ Start time: ____________ Stop time: ____________ Movements: ____________ Date: ____________ Start time: ____________ Stop time: ____________ Movements: ____________ Date: ____________ Start time: ____________ Stop time: ____________ Movements: ____________ Date: ____________ Start time: ____________ Stop time: ____________ Movements: ____________ This information is not intended to replace advice given to you by your health care provider. Make sure you discuss any questions you have with your health care provider. Document Revised: 11/21/2018 Document Reviewed: 11/21/2018 Elsevier Patient Education  2020 [employer].

## 2019-08-21 NOTE — Progress Notes (Signed)
ROB-Growth ultrasound today, results reviewed with patient. Fluid normal, but lower limits Discussed home treatment measures will repeat ultrasound in two (2) weeks. 36 week cultures collected today, see orders. Questions answered and reassurance offered. Anticipatory guidance regarding course of prenatal care. Reviewed red flag symptoms and when to call. RTC x 1 week for ROB or sooner if needed. RTC x 2 weeks for growth ultrasound and ROB or sooner if needed.   ULTRASOUND REPORT  Location: [facility] Date of Service: 08/21/2019   Indications:growth/afi Findings:  Mason Jim intrauterine pregnancy is visualized with FHR at 134 BPM. Biometrics give an (U/S) Gestational age of [redacted]w[redacted]d and an (U/S) EDD of 09/23/2019; this correlates with the clinically established Estimated Date of Delivery: 09/14/19.  Fetal presentation is Variable.  Placenta: posterior. Grade: 1 AFI: 6.9 cm  Growth percentile is 30. EFW: 2668 g ( 5 lbs 14 oz)   Impression: 1. [redacted]w[redacted]d Viable Singleton Intrauterine pregnancy previously established criteria. 2. Growth is 30 %ile.  AFI is 6.9 cm.   Recommendations: 1.Clinical correlation with the patient's History and Physical Exam.

## 2019-08-23 ENCOUNTER — Encounter: Payer: Self-pay | Admitting: Certified Nurse Midwife

## 2019-08-23 DIAGNOSIS — B951 Streptococcus, group B, as the cause of diseases classified elsewhere: Secondary | ICD-10-CM | POA: Insufficient documentation

## 2019-08-23 LAB — STREP GP B NAA: Strep Gp B NAA: POSITIVE — AB

## 2019-08-24 LAB — GC/CHLAMYDIA PROBE AMP
Chlamydia trachomatis, NAA: NEGATIVE
Neisseria Gonorrhoeae by PCR: NEGATIVE

## 2019-08-27 ENCOUNTER — Other Ambulatory Visit: Payer: Self-pay

## 2019-08-27 ENCOUNTER — Encounter: Payer: Self-pay | Admitting: Certified Nurse Midwife

## 2019-08-27 ENCOUNTER — Ambulatory Visit (INDEPENDENT_AMBULATORY_CARE_PROVIDER_SITE_OTHER): Payer: 59 | Admitting: Certified Nurse Midwife

## 2019-08-27 VITALS — BP 123/74 | HR 77 | Wt 159.5 lb

## 2019-08-27 DIAGNOSIS — Z3A37 37 weeks gestation of pregnancy: Secondary | ICD-10-CM

## 2019-08-27 DIAGNOSIS — O288 Other abnormal findings on antenatal screening of mother: Secondary | ICD-10-CM

## 2019-08-27 LAB — POCT URINALYSIS DIPSTICK OB
Bilirubin, UA: NEGATIVE
Blood, UA: NEGATIVE
Glucose, UA: NEGATIVE
Ketones, UA: NEGATIVE
Nitrite, UA: NEGATIVE
Odor: NEGATIVE
POC,PROTEIN,UA: NEGATIVE
Spec Grav, UA: <=1.005 — AB (ref 1.010–1.025)
Urobilinogen, UA: 0.2 E.U./dL
pH, UA: 7.5 (ref 5.0–8.0)

## 2019-08-27 NOTE — Progress Notes (Signed)
ROB

## 2019-08-27 NOTE — Patient Instructions (Signed)
Braxton Hicks Contractions °Contractions of the uterus can occur throughout pregnancy, but they are not always a sign that you are in labor. You may have practice contractions called Braxton Hicks contractions. These false labor contractions are sometimes confused with true labor. °What are Braxton Hicks contractions? °Braxton Hicks contractions are tightening movements that occur in the muscles of the uterus before labor. Unlike true labor contractions, these contractions do not result in opening (dilation) and thinning of the cervix. Toward the end of pregnancy (32-34 weeks), Braxton Hicks contractions can happen more often and may become stronger. These contractions are sometimes difficult to tell apart from true labor because they can be very uncomfortable. You should not feel embarrassed if you go to the hospital with false labor. °Sometimes, the only way to tell if you are in true labor is for your health care provider to look for changes in the cervix. The health care provider will do a physical exam and may monitor your contractions. If you are not in true labor, the exam should show that your cervix is not dilating and your water has not broken. °If there are no other health problems associated with your pregnancy, it is completely safe for you to be sent home with false labor. You may continue to have Braxton Hicks contractions until you go into true labor. °How to tell the difference between true labor and false labor °True labor °· Contractions last 30-70 seconds. °· Contractions become very regular. °· Discomfort is usually felt in the top of the uterus, and it spreads to the lower abdomen and low back. °· Contractions do not go away with walking. °· Contractions usually become more intense and increase in frequency. °· The cervix dilates and gets thinner. °False labor °· Contractions are usually shorter and not as strong as true labor contractions. °· Contractions are usually irregular. °· Contractions  are often felt in the front of the lower abdomen and in the groin. °· Contractions may go away when you walk around or change positions while lying down. °· Contractions get weaker and are shorter-lasting as time goes on. °· The cervix usually does not dilate or become thin. °Follow these instructions at home: ° °· Take over-the-counter and prescription medicines only as told by your health care provider. °· Keep up with your usual exercises and follow other instructions from your health care provider. °· Eat and drink lightly if you think you are going into labor. °· If Braxton Hicks contractions are making you uncomfortable: °? Change your position from lying down or resting to walking, or change from walking to resting. °? Sit and rest in a tub of warm water. °? Drink enough fluid to keep your urine pale yellow. Dehydration may cause these contractions. °? Do slow and deep breathing several times an hour. °· Keep all follow-up prenatal visits as told by your health care provider. This is important. °Contact a health care provider if: °· You have a fever. °· You have continuous pain in your abdomen. °Get help right away if: °· Your contractions become stronger, more regular, and closer together. °· You have fluid leaking or gushing from your vagina. °· You pass blood-tinged mucus (bloody show). °· You have bleeding from your vagina. °· You have low back pain that you never had before. °· You feel your baby’s head pushing down and causing pelvic pressure. °· Your baby is not moving inside you as much as it used to. °Summary °· Contractions that occur before labor are   called Braxton Hicks contractions, false labor, or practice contractions. °· Braxton Hicks contractions are usually shorter, weaker, farther apart, and less regular than true labor contractions. True labor contractions usually become progressively stronger and regular, and they become more frequent. °· Manage discomfort from Braxton Hicks contractions  by changing position, resting in a warm bath, drinking plenty of water, or practicing deep breathing. °This information is not intended to replace advice given to you by your health care provider. Make sure you discuss any questions you have with your health care provider. °Document Revised: 03/16/2017 Document Reviewed: 08/17/2016 °Elsevier Patient Education © 2020 Elsevier Inc. ° °

## 2019-08-27 NOTE — Progress Notes (Signed)
ROB doing well. Has concerns about decreased fetal movement and is anxious about low amniotic fluid. She is questioning if fluid level needs to be re evaluated this week. Reassurance given. Pt states she has been inceasing her fluid intake. Pt placed on NST due to concern for decrease movement.   NST INTERPRETATION:  Indications: decreased fetal movement Baseline 145 Moderate variability Accelerations present Decelerations absent  Toco : 1 ctx.  Impression: reactive   Plan: Follow up 1 wk , rpt u/s one wk.     [name], CNM

## 2019-08-31 ENCOUNTER — Observation Stay
Admission: EM | Admit: 2019-08-31 | Discharge: 2019-08-31 | Disposition: A | Discharge status: Home / Self Care | Payer: 59 | Attending: Certified Nurse Midwife | Admitting: Certified Nurse Midwife

## 2019-08-31 ENCOUNTER — Other Ambulatory Visit: Payer: Self-pay

## 2019-08-31 ENCOUNTER — Encounter: Payer: Self-pay | Admitting: Obstetrics and Gynecology

## 2019-08-31 DIAGNOSIS — O368130 Decreased fetal movements, third trimester, not applicable or unspecified: Secondary | ICD-10-CM | POA: Diagnosis not present

## 2019-08-31 DIAGNOSIS — Z3A38 38 weeks gestation of pregnancy: Secondary | ICD-10-CM | POA: Diagnosis not present

## 2019-08-31 DIAGNOSIS — B951 Streptococcus, group B, as the cause of diseases classified elsewhere: Secondary | ICD-10-CM

## 2019-08-31 NOTE — Progress Notes (Signed)
Pt given discharge paperwork. Reviewed labor precautions and fetal kick counts. Pt to follow up with scheduled appt Thursday for an [place].

## 2019-08-31 NOTE — OB Triage Note (Signed)
    L&D OB Triage Note  SUBJECTIVE Tammy Barton is a 34 y.o. G2P1001 female at [redacted]w[redacted]d, EDD Estimated Date of Delivery: 09/14/19 who presented to triage with complaints of decreased fetal movement. She notes movement once she was placed on monitor.   OB History  Gravida Para Term Preterm AB Living  2 1 1  0 0 1  SAB TAB Ectopic Multiple Live Births  0 0 0 0 1    # Outcome Date GA Lbr Len/2nd Weight Sex Delivery Anes PTL Lv  2 Current           1 Term 01/06/17   3629 g F Vag-Spont   LIV    Medications Prior to Admission  Medication Sig Dispense Refill Last Dose  . Omega-3 Fatty Acids (FISH OIL PO) Take 200 mg by mouth. Take 2 tablets daily.   08/31/2019 at Unknown time  . Prenatal Vit-Fe Fumarate-FA (PRENATAL MULTIVITAMIN) TABS tablet Take 1 tablet by mouth daily at 12 noon.   08/31/2019 at Unknown time  . Probiotic Product (PROBIOTIC DAILY PO) Take by mouth. Take 1 tablet daily.   08/31/2019 at Unknown time     OBJECTIVE  Nursing Evaluation:   BP 130/79 (BP Location: Left Arm)   Pulse (!) 112   Temp 97.9 F (36.6 C) (Oral)   Resp 16   Ht 5\' 5"  (1.651 m)   Wt 72.1 kg   LMP 12/08/2018 (Exact Date)   BMI 26.46 kg/m    Findings:       Reactive NST, with fetal movement noted on strip      NST was performed and has been reviewed by me.  NST INTERPRETATION: Category I  Baseline 150 Moderate Variability Accelerations present Decelerations absent  Toco: Occasional ctx.   ASSESSMENT Impression:  1.  Pregnancy:  G2P1001 at [redacted]w[redacted]d , EDD Estimated Date of Delivery: 09/14/19 2.  Reassuring fetal and maternal status 3.  Reactive NST  PLAN 1. Discussed current condition and above findings with patient and reassurance given.  All questions answered. 2. Discharge home with standard labor precautions given to return to L&D or call the office for problems. 3. Continue routine prenatal care.  [redacted]w[redacted]d, CNM

## 2019-08-31 NOTE — OB Triage Note (Signed)
Pt G2P1 presents at 38w with c/o decreased FM over the past couple days. Pt reports fetal kick counts ok but baby takes a longer amount of time to reach kicks than before. Pt reports she has borderline oligo and has report [place] on Thursday. VSS. Monitors applied. After monitors applied pt reports baby is more active now. Pt questioning if [place] can be done here. Will continue to monitor

## 2019-09-02 ENCOUNTER — Other Ambulatory Visit: Payer: Self-pay

## 2019-09-02 ENCOUNTER — Telehealth: Payer: Self-pay

## 2019-09-02 DIAGNOSIS — Z3A38 38 weeks gestation of pregnancy: Secondary | ICD-10-CM

## 2019-09-02 DIAGNOSIS — O288 Other abnormal findings on antenatal screening of mother: Secondary | ICD-10-CM

## 2019-09-02 NOTE — Telephone Encounter (Signed)
Spoke with patient and informed her that per [facility] pt needs this u/s this week. Apologized for the inconvenience. Encouraged pt to contact her insurance for coverage info.   Appt made for 5/21 at 3 pm. Pt advised to arrive at 2:30. Pt aware that I took first available. Pt given number to centralized scheduling incase she needs to r/s.

## 2019-09-03 NOTE — Telephone Encounter (Signed)
Please handle. Thanks, JML

## 2019-09-04 ENCOUNTER — Other Ambulatory Visit: Payer: 59

## 2019-09-04 ENCOUNTER — Ambulatory Visit (INDEPENDENT_AMBULATORY_CARE_PROVIDER_SITE_OTHER): Payer: 59 | Admitting: Certified Nurse Midwife

## 2019-09-04 ENCOUNTER — Ambulatory Visit
Admission: RE | Admit: 2019-09-04 | Discharge: 2019-09-04 | Disposition: A | Discharge status: Home / Self Care | Payer: 59 | Source: Ambulatory Visit | Attending: Certified Nurse Midwife | Admitting: Certified Nurse Midwife

## 2019-09-04 ENCOUNTER — Encounter: Payer: 59 | Admitting: Certified Nurse Midwife

## 2019-09-04 ENCOUNTER — Other Ambulatory Visit: Payer: Self-pay

## 2019-09-04 VITALS — BP 114/74 | HR 79 | Wt 160.2 lb

## 2019-09-04 DIAGNOSIS — O288 Other abnormal findings on antenatal screening of mother: Secondary | ICD-10-CM

## 2019-09-04 DIAGNOSIS — Z3A38 38 weeks gestation of pregnancy: Secondary | ICD-10-CM

## 2019-09-04 NOTE — Progress Notes (Signed)
ROB-Doing well. Growth and AFI completed at [facility] today, see chart. AFI 8.3 cm, Growth 67%; findings reviewed with patient, verbalized understanding. Questions answered regarding labor preparation techniques and management of GBS+ status in labor. Doula is [employer]. Anticipatory guidance regarding the course of prenatal care. Encouraged twice daily kick counts. Reviewed red flag symptoms and when to call. RTC x 1 week for ROB or sooner if needed.

## 2019-09-04 NOTE — Telephone Encounter (Signed)
Completed.

## 2019-09-04 NOTE — Patient Instructions (Signed)
Fetal Movement Counts Patient Name: ________________________________________________ Patient Due Date: ____________________ What is a fetal movement count?  A fetal movement count is the number of times that you feel your baby move during a certain amount of time. This may also be called a fetal kick count. A fetal movement count is recommended for every pregnant woman. You may be asked to start counting fetal movements as early as week 28 of your pregnancy. Pay attention to when your baby is most active. You may notice your baby's sleep and wake cycles. You may also notice things that make your baby move more. You should do a fetal movement count:  When your baby is normally most active.  At the same time each day. A good time to count movements is while you are resting, after having something to eat and drink. How do I count fetal movements? 1. Find a quiet, comfortable area. Sit, or lie down on your side. 2. Write down the date, the start time and stop time, and the number of movements that you felt between those two times. Take this information with you to your health care visits. 3. Write down your start time when you feel the first movement. 4. Count kicks, flutters, swishes, rolls, and jabs. You should feel at least 10 movements. 5. You may stop counting after you have felt 10 movements, or if you have been counting for 2 hours. Write down the stop time. 6. If you do not feel 10 movements in 2 hours, contact your health care provider for further instructions. Your health care provider may want to do additional tests to assess your baby's well-being. Contact a health care provider if:  You feel fewer than 10 movements in 2 hours.  Your baby is not moving like he or she usually does. Date: ____________ Start time: ____________ Stop time: ____________ Movements: ____________ Date: ____________ Start time: ____________ Stop time: ____________ Movements: ____________ Date: ____________  Start time: ____________ Stop time: ____________ Movements: ____________ Date: ____________ Start time: ____________ Stop time: ____________ Movements: ____________ Date: ____________ Start time: ____________ Stop time: ____________ Movements: ____________ Date: ____________ Start time: ____________ Stop time: ____________ Movements: ____________ Date: ____________ Start time: ____________ Stop time: ____________ Movements: ____________ Date: ____________ Start time: ____________ Stop time: ____________ Movements: ____________ Date: ____________ Start time: ____________ Stop time: ____________ Movements: ____________ This information is not intended to replace advice given to you by your health care provider. Make sure you discuss any questions you have with your health care provider. Document Revised: 11/21/2018 Document Reviewed: 11/21/2018 Elsevier Patient Education  2020 Elsevier Inc.  

## 2019-09-05 ENCOUNTER — Ambulatory Visit: Payer: 59

## 2019-09-09 ENCOUNTER — Encounter: Payer: Self-pay | Admitting: Certified Nurse Midwife

## 2019-09-09 ENCOUNTER — Other Ambulatory Visit: Payer: Self-pay

## 2019-09-09 ENCOUNTER — Ambulatory Visit (INDEPENDENT_AMBULATORY_CARE_PROVIDER_SITE_OTHER): Payer: 59 | Admitting: Certified Nurse Midwife

## 2019-09-09 VITALS — BP 112/71 | HR 82 | Wt 164.3 lb

## 2019-09-09 DIAGNOSIS — Z3A39 39 weeks gestation of pregnancy: Secondary | ICD-10-CM

## 2019-09-09 LAB — POCT URINALYSIS DIPSTICK OB
Bilirubin, UA: NEGATIVE
Blood, UA: NEGATIVE
Glucose, UA: NEGATIVE
Ketones, UA: NEGATIVE
Leukocytes, UA: NEGATIVE
Nitrite, UA: NEGATIVE
POC,PROTEIN,UA: NEGATIVE
Spec Grav, UA: 1.025 (ref 1.010–1.025)
Urobilinogen, UA: 0.2 E.U./dL
pH, UA: 5 (ref 5.0–8.0)

## 2019-09-09 NOTE — Patient Instructions (Signed)
Braxton Hicks Contractions °Contractions of the uterus can occur throughout pregnancy, but they are not always a sign that you are in labor. You may have practice contractions called Braxton Hicks contractions. These false labor contractions are sometimes confused with true labor. °What are Braxton Hicks contractions? °Braxton Hicks contractions are tightening movements that occur in the muscles of the uterus before labor. Unlike true labor contractions, these contractions do not result in opening (dilation) and thinning of the cervix. Toward the end of pregnancy (32-34 weeks), Braxton Hicks contractions can happen more often and may become stronger. These contractions are sometimes difficult to tell apart from true labor because they can be very uncomfortable. You should not feel embarrassed if you go to the hospital with false labor. °Sometimes, the only way to tell if you are in true labor is for your health care provider to look for changes in the cervix. The health care provider will do a physical exam and may monitor your contractions. If you are not in true labor, the exam should show that your cervix is not dilating and your water has not broken. °If there are no other health problems associated with your pregnancy, it is completely safe for you to be sent home with false labor. You may continue to have Braxton Hicks contractions until you go into true labor. °How to tell the difference between true labor and false labor °True labor °· Contractions last 30-70 seconds. °· Contractions become very regular. °· Discomfort is usually felt in the top of the uterus, and it spreads to the lower abdomen and low back. °· Contractions do not go away with walking. °· Contractions usually become more intense and increase in frequency. °· The cervix dilates and gets thinner. °False labor °· Contractions are usually shorter and not as strong as true labor contractions. °· Contractions are usually irregular. °· Contractions  are often felt in the front of the lower abdomen and in the groin. °· Contractions may go away when you walk around or change positions while lying down. °· Contractions get weaker and are shorter-lasting as time goes on. °· The cervix usually does not dilate or become thin. °Follow these instructions at home: ° °· Take over-the-counter and prescription medicines only as told by your health care provider. °· Keep up with your usual exercises and follow other instructions from your health care provider. °· Eat and drink lightly if you think you are going into labor. °· If Braxton Hicks contractions are making you uncomfortable: °? Change your position from lying down or resting to walking, or change from walking to resting. °? Sit and rest in a tub of warm water. °? Drink enough fluid to keep your urine pale yellow. Dehydration may cause these contractions. °? Do slow and deep breathing several times an hour. °· Keep all follow-up prenatal visits as told by your health care provider. This is important. °Contact a health care provider if: °· You have a fever. °· You have continuous pain in your abdomen. °Get help right away if: °· Your contractions become stronger, more regular, and closer together. °· You have fluid leaking or gushing from your vagina. °· You pass blood-tinged mucus (bloody show). °· You have bleeding from your vagina. °· You have low back pain that you never had before. °· You feel your baby’s head pushing down and causing pelvic pressure. °· Your baby is not moving inside you as much as it used to. °Summary °· Contractions that occur before labor are   called Braxton Hicks contractions, false labor, or practice contractions. °· Braxton Hicks contractions are usually shorter, weaker, farther apart, and less regular than true labor contractions. True labor contractions usually become progressively stronger and regular, and they become more frequent. °· Manage discomfort from Braxton Hicks contractions  by changing position, resting in a warm bath, drinking plenty of water, or practicing deep breathing. °This information is not intended to replace advice given to you by your health care provider. Make sure you discuss any questions you have with your health care provider. °Document Revised: 03/16/2017 Document Reviewed: 08/17/2016 °Elsevier Patient Education © 2020 Elsevier Inc. ° °

## 2019-09-09 NOTE — Progress Notes (Signed)
ROB doing well. Feels good movement. Discussed u/s for growth and afi next visit. Reviewed induction by 41 wks. Reviewed birth plan. Scanned into chart. Want natural birth, undirected pushing, use of water if possible. Reviewed , answered all her questions. She is not opposed to induction if necessary. Follow up 1 wk with Marcelino Duster.   [name], CNM

## 2019-09-11 ENCOUNTER — Encounter: Payer: 59 | Admitting: Certified Nurse Midwife

## 2019-09-11 ENCOUNTER — Other Ambulatory Visit: Payer: 59

## 2019-09-14 ENCOUNTER — Inpatient Hospital Stay: Admit: 2019-09-14 | Payer: Self-pay

## 2019-09-16 ENCOUNTER — Inpatient Hospital Stay
Admission: EM | Admit: 2019-09-16 | Discharge: 2019-09-17 | DRG: 807 | Disposition: A | Discharge status: Home / Self Care | Payer: 59 | Attending: Certified Nurse Midwife | Admitting: Certified Nurse Midwife

## 2019-09-16 ENCOUNTER — Other Ambulatory Visit: Payer: Self-pay

## 2019-09-16 ENCOUNTER — Encounter: Payer: Self-pay | Admitting: Certified Nurse Midwife

## 2019-09-16 DIAGNOSIS — Z3A40 40 weeks gestation of pregnancy: Secondary | ICD-10-CM

## 2019-09-16 DIAGNOSIS — Z20822 Contact with and (suspected) exposure to covid-19: Secondary | ICD-10-CM | POA: Diagnosis present

## 2019-09-16 DIAGNOSIS — O26893 Other specified pregnancy related conditions, third trimester: Secondary | ICD-10-CM | POA: Diagnosis present

## 2019-09-16 DIAGNOSIS — O99824 Streptococcus B carrier state complicating childbirth: Secondary | ICD-10-CM | POA: Diagnosis present

## 2019-09-16 LAB — SARS CORONAVIRUS 2 BY RT PCR (HOSPITAL ORDER, PERFORMED IN ~~LOC~~ HOSPITAL LAB): SARS Coronavirus 2: NEGATIVE

## 2019-09-16 LAB — TYPE AND SCREEN
ABO/RH(D): O POS
Antibody Screen: NEGATIVE

## 2019-09-16 LAB — CBC
HCT: 40.3 % (ref 36.0–46.0)
Hemoglobin: 14.1 g/dL (ref 12.0–15.0)
MCH: 34.5 pg — ABNORMAL HIGH (ref 26.0–34.0)
MCHC: 35 g/dL (ref 30.0–36.0)
MCV: 98.5 fL (ref 80.0–100.0)
Platelets: 202 10*3/uL (ref 150–400)
RBC: 4.09 MIL/uL (ref 3.87–5.11)
RDW: 12.1 % (ref 11.5–15.5)
WBC: 12.6 10*3/uL — ABNORMAL HIGH (ref 4.0–10.5)
nRBC: 0 % (ref 0.0–0.2)

## 2019-09-16 LAB — ABO/RH: ABO/RH(D): O POS

## 2019-09-16 MED ORDER — ONDANSETRON HCL 4 MG PO TABS: 4.0000 mg | ORAL_TABLET | ORAL | Status: DC | PRN

## 2019-09-16 MED ORDER — COCONUT OIL OIL
1.0000 "application " | TOPICAL_OIL | Status: DC | PRN
  Administered 2019-09-16: 1 via TOPICAL
  Filled 2019-09-16 (×2): qty 120

## 2019-09-16 MED ORDER — MISOPROSTOL 200 MCG PO TABS
ORAL_TABLET | ORAL | Status: AC
  Filled 2019-09-16: qty 4

## 2019-09-16 MED ORDER — ONDANSETRON HCL 4 MG/2ML IJ SOLN: 4.0000 mg | INTRAMUSCULAR | Status: DC | PRN

## 2019-09-16 MED ORDER — WITCH HAZEL-GLYCERIN EX PADS: 1.0000 "application " | MEDICATED_PAD | CUTANEOUS | Status: DC | PRN

## 2019-09-16 MED ORDER — OXYTOCIN 40 UNITS IN NORMAL SALINE INFUSION - SIMPLE MED
INTRAVENOUS | Status: AC
  Filled 2019-09-16: qty 1000

## 2019-09-16 MED ORDER — OXYCODONE-ACETAMINOPHEN 5-325 MG PO TABS: 2.0000 | ORAL_TABLET | ORAL | Status: DC | PRN

## 2019-09-16 MED ORDER — ACETAMINOPHEN 325 MG PO TABS: 650.0000 mg | ORAL_TABLET | ORAL | Status: DC | PRN

## 2019-09-16 MED ORDER — SOD CITRATE-CITRIC ACID 500-334 MG/5ML PO SOLN: 30.0000 mL | ORAL | Status: DC | PRN

## 2019-09-16 MED ORDER — OXYCODONE-ACETAMINOPHEN 5-325 MG PO TABS: 1.0000 | ORAL_TABLET | ORAL | Status: DC | PRN

## 2019-09-16 MED ORDER — DIBUCAINE (PERIANAL) 1 % EX OINT: 1.0000 "application " | TOPICAL_OINTMENT | CUTANEOUS | Status: DC | PRN

## 2019-09-16 MED ORDER — SODIUM CHLORIDE 0.9% FLUSH: 3.0000 mL | Freq: Two times a day (BID) | INTRAVENOUS | Status: DC

## 2019-09-16 MED ORDER — SENNOSIDES-DOCUSATE SODIUM 8.6-50 MG PO TABS
2.0000 | ORAL_TABLET | ORAL | Status: DC
  Administered 2019-09-17: 2 via ORAL
  Filled 2019-09-16: qty 2

## 2019-09-16 MED ORDER — SODIUM CHLORIDE 0.9% FLUSH: 3.0000 mL | INTRAVENOUS | Status: DC | PRN

## 2019-09-16 MED ORDER — AMMONIA AROMATIC IN INHA
RESPIRATORY_TRACT | Status: AC
  Filled 2019-09-16: qty 10

## 2019-09-16 MED ORDER — ONDANSETRON HCL 4 MG/2ML IJ SOLN: 4.0000 mg | Freq: Four times a day (QID) | INTRAMUSCULAR | Status: DC | PRN

## 2019-09-16 MED ORDER — BENZOCAINE-MENTHOL 20-0.5 % EX AERO
INHALATION_SPRAY | CUTANEOUS | Status: AC
  Filled 2019-09-16: qty 56

## 2019-09-16 MED ORDER — ACETAMINOPHEN 325 MG PO TABS
650.0000 mg | ORAL_TABLET | ORAL | Status: DC | PRN
  Filled 2019-09-16: qty 2

## 2019-09-16 MED ORDER — SODIUM CHLORIDE 0.9 % IV SOLN: 250.0000 mL | INTRAVENOUS | Status: DC | PRN

## 2019-09-16 MED ORDER — BUTORPHANOL TARTRATE 1 MG/ML IJ SOLN: 1.0000 mg | INTRAMUSCULAR | Status: DC | PRN

## 2019-09-16 MED ORDER — PRENATAL MULTIVITAMIN CH: 1.0000 | ORAL_TABLET | Freq: Every day | ORAL | Status: DC

## 2019-09-16 MED ORDER — LIDOCAINE HCL (PF) 1 % IJ SOLN
INTRAMUSCULAR | Status: AC
  Filled 2019-09-16: qty 30

## 2019-09-16 MED ORDER — DIPHENHYDRAMINE HCL 25 MG PO CAPS: 25.0000 mg | ORAL_CAPSULE | Freq: Four times a day (QID) | ORAL | Status: DC | PRN

## 2019-09-16 MED ORDER — OXYTOCIN 10 UNIT/ML IJ SOLN
INTRAMUSCULAR | Status: AC
  Filled 2019-09-16: qty 2

## 2019-09-16 MED ORDER — BENZOCAINE-MENTHOL 20-0.5 % EX AERO
1.0000 "application " | INHALATION_SPRAY | CUTANEOUS | Status: DC | PRN
  Administered 2019-09-16: 1 via TOPICAL
  Filled 2019-09-16: qty 56

## 2019-09-16 MED ORDER — IBUPROFEN 600 MG PO TABS
600.0000 mg | ORAL_TABLET | Freq: Four times a day (QID) | ORAL | Status: DC
  Administered 2019-09-16 – 2019-09-17 (×4): 600 mg via ORAL
  Filled 2019-09-16 (×4): qty 1

## 2019-09-16 MED ORDER — LIDOCAINE HCL (PF) 1 % IJ SOLN: 30.0000 mL | INTRAMUSCULAR | Status: DC | PRN

## 2019-09-16 MED ORDER — SIMETHICONE 80 MG PO CHEW: 80.0000 mg | CHEWABLE_TABLET | ORAL | Status: DC | PRN

## 2019-09-16 NOTE — H&P (Signed)
Obstetric History and Physical  Tammy Barton is a 34 y.o. G2P1001 with IUP at [redacted]w[redacted]d presenting with contractions since 0830.   Patient states she has been having  regular, every few minutes contractions, none vaginal bleeding, intact membranes, with active fetal movement.    Denies difficulty breathing or respiratory distress, chest pain, dysuria, and leg pain or swelling.   Prenatal Course  Source of Care: [facility]-initial visit at 12 weeks, total visits  Pregnancy complications or risks: 11  Prenatal labs and studies:  ABO, Rh: O/Positive/-- 03-06-2023 1514)  Antibody: Negative 2023-03-06 1514)  Rubella: 1.68 03-06-2023 1514)  RPR: Non Reactive (03/12 0956)   HBsAg: Negative 2023/03/06 1514)   HIV: Unknown  ATF:TDDUKGUR/-- (05/06 1147)  1 hr Glucola: 150  Glucose Tolerance Test: 74, 178, 164, 128  Genetic screening: Declined  Anatomy US: Complete, normal  Past Medical History:  Diagnosis Date  . Iron overload     Past Surgical History:  Procedure Laterality Date  . BREAST SURGERY    . left breast mass, biopsy, benign       OB History  Gravida Para Term Preterm AB Living  2 1 1     1   SAB TAB Ectopic Multiple Live Births          1    # Outcome Date GA Lbr Len/2nd Weight Sex Delivery Anes PTL Lv  2 Current           1 Term 01/06/17   3629 g F Vag-Spont   LIV    Social History   Socioeconomic History  . Marital status: Married    Spouse name: Not on file  . Number of children: Not on file  . Years of education: Not on file  . Highest education level: Not on file  Occupational History  . Not on file  Tobacco Use  . Smoking status: Never Smoker  . Smokeless tobacco: Never Used  Substance and Sexual Activity  . Alcohol use: No  . Drug use: No  . Sexual activity: Yes    Birth control/protection: None  Other Topics Concern  . Not on file  Social History Narrative  . Not on file   Social Determinants of Health   Financial Resource Strain:   .  Difficulty of Paying Living Expenses:   Food Insecurity:   . Worried About 01/08/17 in the Last Year:   . Programme researcher, broadcasting/film/video in the Last Year:   Transportation Needs:   . Barista (Medical):   Freight forwarder Lack of Transportation (Non-Medical):   Physical Activity:   . Days of Exercise per Week:   . Minutes of Exercise per Session:   Stress:   . Feeling of Stress :   Social Connections:   . Frequency of Communication with Friends and Family:   . Frequency of Social Gatherings with Friends and Family:   . Attends Religious Services:   . Active Member of Clubs or Organizations:   . Attends [employer] Meetings:   Banker Marital Status:     Family History  Problem Relation Age of Onset  . Diabetes Paternal Grandmother     Medications Prior to Admission  Medication Sig Dispense Refill Last Dose  . Omega-3 Fatty Acids (FISH OIL PO) Take 200 mg by mouth. Take 2 tablets daily.     . Prenatal Vit-Fe Fumarate-FA (PRENATAL MULTIVITAMIN) TABS tablet Take 1 tablet by mouth daily at 12 noon.     . Probiotic  Product (PROBIOTIC DAILY PO) Take by mouth. Take 1 tablet daily.       No Known Allergies  Review of Systems: Negative except for what is mentioned in HPI.  Physical Exam:  BP 134/64   Pulse 85   Temp 98.2 F (36.8 C) (Oral)   Resp 18   Ht 5\' 5"  (1.651 m)   Wt 70 kg   LMP 12/08/2018 (Exact Date)   BMI 25.68 kg/m    GENERAL: Well-developed, well-nourished female in no acute distress.   LUNGS: Clear to auscultation bilaterally.   HEART: Regular rate and rhythm.  ABDOMEN: Soft, nontender, nondistended, gravid.  EXTREMITIES: Nontender, no edema, 2+ distal pulses.  Cervical Exam: Dilation: 8.5  FHR Category I  Contractions: Every one (1) to two (2) minutes; soft resting tone   Pertinent Labs/Studies:    No results found for this or any previous visit (from the past 24 hour(s)).  Assessment : Tammy Barton is a 34 y.o. G2P1001 at [redacted]w[redacted]d  being admitted for labor, Rh positive, GBS positive  FHR Category I  Plan:  Admit to birthing suites, see orders.   Labor: Expectant management.   Delivery plan: Hopeful for vaginal birth.   Dr. Marcelline Mates notified of admission and imminent birth.   Diona Fanti, CNM [facility], [facility]

## 2019-09-16 NOTE — Progress Notes (Signed)
At 10:02 AM I was called for a precipitous delivery of a viable female child who was delivered via Vaginal, Spontaneous (Presentation: LOA).  APGAR:8, 9; weight pending. Fetus delivered en caul with patient in hands and knees.  Membrane ruptured was performed following delivery of the head.  No nuchal cords.   Serafina Royals, CNM arrived and took over the at this point for delivery of placenta.

## 2019-09-16 NOTE — Progress Notes (Signed)
Pt arrived Via wheelchair from ER for  Contractions starting around 0830. It visible looks uncomfortable with contractions, moaning and rocking at bedside. Pt unable to get into bed and wishing to stand. Pt states her water has not broke, but she has blood mucus running down her leg. RN attempting to get FHT and cervical exam. Husband at bedside.

## 2019-09-16 NOTE — Lactation Note (Signed)
This note was copied from a baby's chart. Lactation Consultation Note  Patient Name: Tammy Tammy Barton Date: 09/16/2019 Reason for consult: Initial assessment;Term  LC in to see mom and BG Sipos. Mom is G2P2, delivered SVD, quickly. Mom has breastfeeding experience, for 18 months with first child, now 58yrs old. Mom and dad report a rocky start with breastfeeding the first time: painful latch, cracked/bleeding nipples. LC observed positioning of infant at the breast, guided on turning baby in for alignment of ears, shoulder, and hip. Mom rubbed nipple from nose to chin, baby had wide open mouth, and latched easily. LC adjusted bottom lip, and provided support pillows. Baby sucked rhythmically for 15 minutes before falling asleep, transitioned to opposite breast (right), where mom felt a little more uncomfortable; baby had flanged top/bottom lips, but mom felt slight pinching and nipple came out pinched. Colostrum hand expressed and rubbed on nipple for healing. Answered questions regarding positioning, encouraged to bring baby to breast, use support pillows, importance of positioning and alignment and nose to nipple before latching. Encouraged to feed on cues: reviewed early hunger cues, newborn stomach size, and feeding patterns in the first 24 hours, and output expectations. All questions answered at this time. LC name/number on whiteboard.  Mom desires to learn football hold next feeding.  Maternal Data Formula Feeding for Exclusion: No Has patient been taught Hand Expression?: Yes Does the patient have breastfeeding experience prior to this delivery?: Yes  Feeding Feeding Type: Breast Fed  LATCH Score Latch: Grasps breast easily, tongue down, lips flanged, rhythmical sucking.  Audible Swallowing: A few with stimulation  Type of Nipple: Everted at rest and after stimulation  Comfort (Breast/Nipple): Soft / non-tender  Hold (Positioning): Assistance needed to  correctly position infant at breast and maintain latch.  LATCH Score: 8  Interventions Interventions: Breast feeding basics reviewed;Assisted with latch;Hand express;Adjust position;Support pillows;Coconut oil  Lactation Tools Discussed/Used     Consult Status Consult Status: Follow-up Date: 09/16/19 Follow-up type: In-patient    [name] 09/16/2019, 3:32 PM

## 2019-09-17 ENCOUNTER — Encounter: Payer: Self-pay | Admitting: Certified Nurse Midwife

## 2019-09-17 LAB — CBC
HCT: 38.2 % (ref 36.0–46.0)
Hemoglobin: 13.7 g/dL (ref 12.0–15.0)
MCH: 34.9 pg — ABNORMAL HIGH (ref 26.0–34.0)
MCHC: 35.9 g/dL (ref 30.0–36.0)
MCV: 97.2 fL (ref 80.0–100.0)
Platelets: 198 10*3/uL (ref 150–400)
RBC: 3.93 MIL/uL (ref 3.87–5.11)
RDW: 12.3 % (ref 11.5–15.5)
WBC: 13.3 10*3/uL — ABNORMAL HIGH (ref 4.0–10.5)
nRBC: 0 % (ref 0.0–0.2)

## 2019-09-17 LAB — RAPID HIV SCREEN (HIV 1/2 AB+AG)
HIV 1/2 Antibodies: NONREACTIVE
HIV-1 P24 Antigen - HIV24: NONREACTIVE

## 2019-09-17 LAB — HM HIV SCREENING LAB: HM HIV Screening: NEGATIVE

## 2019-09-17 LAB — RPR: RPR Ser Ql: NONREACTIVE

## 2019-09-17 MED ORDER — COCONUT OIL OIL: 1.0000 "application " | TOPICAL_OIL | 0 refills | Status: DC | PRN

## 2019-09-17 MED ORDER — IBUPROFEN 600 MG PO TABS: 600.0000 mg | ORAL_TABLET | Freq: Four times a day (QID) | ORAL | 0 refills | Status: DC

## 2019-09-17 MED ORDER — ACETAMINOPHEN 325 MG PO TABS: 650.0000 mg | ORAL_TABLET | ORAL | 1 refills | Status: DC | PRN

## 2019-09-17 NOTE — Discharge Summary (Addendum)
Dc previously reviewed with pt. Pt dc'd to home via wc with infant in arms. Placed in backseat of vehicle by FOB 

## 2019-09-17 NOTE — Progress Notes (Signed)
Patient ID: Tammy Barton, female   DOB: June 05, 1985, 34 y.o.   MRN: [id number]  Post Partum Day # 1, s/p spontaneous vaginal birth, Rh positive, GBS positive, Breastfeeding  Subjective:  Patient doing well, question use of all purpose nipple ointment.   Denies difficulty breathing or respiratory distress, chest pain, abdominal pain, excessive vaginal bleeding, dysuria, and leg pain or swelling.   Infant sleeping in bassinet at bedside. FOB present for support.   Objective: Temp:  [98 F (36.7 C)-98.5 F (36.9 C)] 98.3 F (36.8 C) (06/02 0210) Pulse Rate:  [72-90] 72 (06/02 0210) Resp:  [18-20] 20 (06/02 0210) BP: (107-134)/(46-81) 122/75 (06/02 0210) SpO2:  [96 %-99 %] 99 % (06/02 0210) Weight:  [70 kg] 70 kg (06/01 1043)  Physical Exam:   General: alert and cooperative   Lungs: clear to auscultation bilaterally  Breasts: deferred, no complaints  Heart: normal apical impulse  Abdomen: soft, non-tender; bowel sounds normal; no masses,  no organomegaly  Pelvis: Lochia: appropriate, Uterine Fundus: firm  Extremities: DVT Evaluation: No evidence of DVT seen on physical exam.  Recent Labs    09/16/19 1103 09/17/19 0613  HGB 14.1 13.7  HCT 40.3 38.2    Assessment:  34 year old G2P2, Post Partum Day # 1, s/p spontaneous vaginal birth, Rh positive, GBS positive  Breastfeeding  Plan:  Routine postpartum education and care  Lactation consultation as needed  Reviewed red flag symptoms and when to call  Plan for discharge tomorrow   LOS: 1 day     Tammy Barton, CNM [facility], [facility] 09/17/2019 7:19 AM

## 2019-09-17 NOTE — Discharge Instructions (Signed)
Congratulation on your new bundle of joy!  Please call the midwife if you have a temperature greater than 100.4 F; pain, redness, cracking, bleeding, swelling to your nipples or breast; flu like symptoms that could be mastitis; abdominal pain unrelieved by rest, hydration, motrin or tylenol; bright red vaginal bleeding the color of elmo or a fire truck with clots larger than your fist or orange.   It is normal for you to sweat a lot, urinate a lot and have swelling in both legs; however, if one leg is more swollen than the other or you have pain when you point and flex your foot, please give [place] a call.   Baby blues peak around three (3) weeks after birth. One (1) in seven (7) moms and one (1) in 10 dads will experience the symptoms of postpartum depression anywhere from one (1) week to one (1) year after birth. There is nothing wrong with these symptoms, but we can not help you if we do not know.   There is something rare called postpartum pyhcosis where mom's have thoughts of harming themselves or someone else. If this happen, then notify us immediately.   Someone from the office will call you to schedule a two (2) week televisit to check in. They will also schedule your six (6) week postpartum visit in the office as well.   Let us know if you need anything else.    Gunnar Bulla, CNM [facility], [facility] 09/17/19 8:25 AM      Breastfeeding  Choosing to breastfeed is one of the best decisions you can make for yourself and your baby. A change in hormones during pregnancy causes your breasts to make breast milk in your milk-producing glands. Hormones prevent breast milk from being released before your baby is born. They also prompt milk flow after birth. Once breastfeeding has begun, thoughts of your baby, as well as his or her sucking or crying, can stimulate the release of milk from your milk-producing glands. Benefits of breastfeeding Research shows that breastfeeding  offers many health benefits for infants and mothers. It also offers a cost-free and convenient way to feed your baby. For your baby  Your first milk (colostrum) helps your baby's digestive system to function better.  Special cells in your milk (antibodies) help your baby to fight off infections.  Breastfed babies are less likely to develop asthma, allergies, obesity, or type 2 diabetes. They are also at lower risk for sudden infant death syndrome (SIDS).  Nutrients in breast milk are better able to meet your baby's needs compared to infant formula.  Breast milk improves your baby's brain development. For you  Breastfeeding helps to create a very special bond between you and your baby.  Breastfeeding is convenient. Breast milk costs nothing and is always available at the correct temperature.  Breastfeeding helps to burn calories. It helps you to lose the weight that you gained during pregnancy.  Breastfeeding makes your uterus return faster to its size before pregnancy. It also slows bleeding (lochia) after you give birth.  Breastfeeding helps to lower your risk of developing type 2 diabetes, osteoporosis, rheumatoid arthritis, cardiovascular disease, and breast, ovarian, uterine, and endometrial cancer later in life. Breastfeeding basics Starting breastfeeding  Find a comfortable place to sit or lie down, with your neck and back well-supported.  Place a pillow or a rolled-up blanket under your baby to bring him or her to the level of your breast (if you are seated). Nursing pillows are  specially designed to help support your arms and your baby while you breastfeed.  Make sure that your baby's tummy (abdomen) is facing your abdomen.  Gently massage your breast. With your fingertips, massage from the outer edges of your breast inward toward the nipple. This encourages milk flow. If your milk flows slowly, you may need to continue this action during the feeding.  Support your breast  with 4 fingers underneath and your thumb above your nipple (make the letter "C" with your hand). Make sure your fingers are well away from your nipple and your baby's mouth.  Stroke your baby's lips gently with your finger or nipple.  When your baby's mouth is open wide enough, quickly bring your baby to your breast, placing your entire nipple and as much of the areola as possible into your baby's mouth. The areola is the colored area around your nipple. ? More areola should be visible above your baby's upper lip than below the lower lip. ? Your baby's lips should be opened and extended outward (flanged) to ensure an adequate, comfortable latch. ? Your baby's tongue should be between his or her lower gum and your breast.  Make sure that your baby's mouth is correctly positioned around your nipple (latched). Your baby's lips should create a seal on your breast and be turned out (everted).  It is common for your baby to suck about 2-3 minutes in order to start the flow of breast milk. Latching Teaching your baby how to latch onto your breast properly is very important. An improper latch can cause nipple pain, decreased milk supply, and poor weight gain in your baby. Also, if your baby is not latched onto your nipple properly, he or she may swallow some air during feeding. This can make your baby fussy. Burping your baby when you switch breasts during the feeding can help to get rid of the air. However, teaching your baby to latch on properly is still the best way to prevent fussiness from swallowing air while breastfeeding. Signs that your baby has successfully latched onto your nipple  Silent tugging or silent sucking, without causing you pain. Infant's lips should be extended outward (flanged).  Swallowing heard between every 3-4 sucks once your milk has started to flow (after your let-down milk reflex occurs).  Muscle movement above and in front of his or her ears while sucking. Signs that your  baby has not successfully latched onto your nipple  Sucking sounds or smacking sounds from your baby while breastfeeding.  Nipple pain. If you think your baby has not latched on correctly, slip your finger into the corner of your baby's mouth to break the suction and place it between your baby's gums. Attempt to start breastfeeding again. Signs of successful breastfeeding Signs from your baby  Your baby will gradually decrease the number of sucks or will completely stop sucking.  Your baby will fall asleep.  Your baby's body will relax.  Your baby will retain a small amount of milk in his or her mouth.  Your baby will let go of your breast by himself or herself. Signs from you  Breasts that have increased in firmness, weight, and size 1-3 hours after feeding.  Breasts that are softer immediately after breastfeeding.  Increased milk volume, as well as a change in milk consistency and color by the fifth day of breastfeeding.  Nipples that are not sore, cracked, or bleeding. Signs that your baby is getting enough milk  Wetting at least 1-2 diapers  during the first 24 hours after birth.  Wetting at least 5-6 diapers every 24 hours for the first week after birth. The urine should be clear or pale yellow by the age of 5 days.  Wetting 6-8 diapers every 24 hours as your baby continues to grow and develop.  At least 3 stools in a 24-hour period by the age of 5 days. The stool should be soft and yellow.  At least 3 stools in a 24-hour period by the age of 7 days. The stool should be seedy and yellow.  No loss of weight greater than 10% of birth weight during the first 3 days of life.  Average weight gain of 4-7 oz (113-198 g) per week after the age of 4 days.  Consistent daily weight gain by the age of 5 days, without weight loss after the age of 2 weeks. After a feeding, your baby may spit up a small amount of milk. This is normal. Breastfeeding frequency and duration Frequent  feeding will help you make more milk and can prevent sore nipples and extremely full breasts (breast engorgement). Breastfeed when you feel the need to reduce the fullness of your breasts or when your baby shows signs of hunger. This is called "breastfeeding on demand." Signs that your baby is hungry include:  Increased alertness, activity, or restlessness.  Movement of the head from side to side.  Opening of the mouth when the corner of the mouth or cheek is stroked (rooting).  Increased sucking sounds, smacking lips, cooing, sighing, or squeaking.  Hand-to-mouth movements and sucking on fingers or hands.  Fussing or crying. Avoid introducing a pacifier to your baby in the first 4-6 weeks after your baby is born. After this time, you may choose to use a pacifier. Research has shown that pacifier use during the first year of a baby's life decreases the risk of sudden infant death syndrome (SIDS). Allow your baby to feed on each breast as long as he or she wants. When your baby unlatches or falls asleep while feeding from the first breast, offer the second breast. Because newborns are often sleepy in the first few weeks of life, you may need to awaken your baby to get him or her to feed. Breastfeeding times will vary from baby to baby. However, the following rules can serve as a guide to help you make sure that your baby is properly fed:  Newborns (babies 79 weeks of age or younger) may breastfeed every 1-3 hours.  Newborns should not go without breastfeeding for longer than 3 hours during the day or 5 hours during the night.  You should breastfeed your baby a minimum of 8 times in a 24-hour period. Breast milk pumping     Pumping and storing breast milk allows you to make sure that your baby is exclusively fed your breast milk, even at times when you are unable to breastfeed. This is especially important if you go back to work while you are still breastfeeding, or if you are not able to be  present during feedings. Your lactation consultant can help you find a method of pumping that works best for you and give you guidelines about how long it is safe to store breast milk. Caring for your breasts while you breastfeed Nipples can become dry, cracked, and sore while breastfeeding. The following recommendations can help keep your breasts moisturized and healthy:  Avoid using soap on your nipples.  Wear a supportive bra designed especially for nursing. Avoid  wearing underwire-style bras or extremely tight bras (sports bras).  Air-dry your nipples for 3-4 minutes after each feeding.  Use only cotton bra pads to absorb leaked breast milk. Leaking of breast milk between feedings is normal.  Use lanolin on your nipples after breastfeeding. Lanolin helps to maintain your skin's normal moisture barrier. Pure lanolin is not harmful (not toxic) to your baby. You may also hand express a few drops of breast milk and gently massage that milk into your nipples and allow the milk to air-dry. In the first few weeks after giving birth, some women experience breast engorgement. Engorgement can make your breasts feel heavy, warm, and tender to the touch. Engorgement peaks within 3-5 days after you give birth. The following recommendations can help to ease engorgement:  Completely empty your breasts while breastfeeding or pumping. You may want to start by applying warm, moist heat (in the shower or with warm, water-soaked hand towels) just before feeding or pumping. This increases circulation and helps the milk flow. If your baby does not completely empty your breasts while breastfeeding, pump any extra milk after he or she is finished.  Apply ice packs to your breasts immediately after breastfeeding or pumping, unless this is too uncomfortable for you. To do this: ? Put ice in a plastic bag. ? Place a towel between your skin and the bag. ? Leave the ice on for 20 minutes, 2-3 times a day.  Make sure  that your baby is latched on and positioned properly while breastfeeding. If engorgement persists after 48 hours of following these recommendations, contact your health care provider or a Advertising copywriter. Overall health care recommendations while breastfeeding  Eat 3 healthy meals and 3 snacks every day. Well-nourished mothers who are breastfeeding need an additional 450-500 calories a day. You can meet this requirement by increasing the amount of a balanced diet that you eat.  Drink enough water to keep your urine pale yellow or clear.  Rest often, relax, and continue to take your prenatal vitamins to prevent fatigue, stress, and low vitamin and mineral levels in your body (nutrient deficiencies).  Do not use any products that contain nicotine or tobacco, such as cigarettes and e-cigarettes. Your baby may be harmed by chemicals from cigarettes that pass into breast milk and exposure to secondhand smoke. If you need help quitting, ask your health care provider.  Avoid alcohol.  Do not use illegal drugs or marijuana.  Talk with your health care provider before taking any medicines. These include over-the-counter and prescription medicines as well as vitamins and herbal supplements. Some medicines that may be harmful to your baby can pass through breast milk.  It is possible to become pregnant while breastfeeding. If birth control is desired, ask your health care provider about options that will be safe while breastfeeding your baby. Where to find more information: [employer]: www.llli.org Contact a health care provider if:  You feel like you want to stop breastfeeding or have become frustrated with breastfeeding.  Your nipples are cracked or bleeding.  Your breasts are red, tender, or warm.  You have: ? Painful breasts or nipples. ? A swollen area on either breast. ? A fever or chills. ? Nausea or vomiting. ? Drainage other than breast milk from your  nipples.  Your breasts do not become full before feedings by the fifth day after you give birth.  You feel sad and depressed.  Your baby is: ? Too sleepy to eat well. ? Having  trouble sleeping. ? More than 68 week old and wetting fewer than 6 diapers in a 24-hour period. ? Not gaining weight by 67 days of age.  Your baby has fewer than 3 stools in a 24-hour period.  Your baby's skin or the white parts of his or her eyes become yellow. Get help right away if:  Your baby is overly tired (lethargic) and does not want to wake up and feed.  Your baby develops an unexplained fever. Summary  Breastfeeding offers many health benefits for infant and mothers.  Try to breastfeed your infant when he or she shows early signs of hunger.  Gently tickle or stroke your baby's lips with your finger or nipple to allow the baby to open his or her mouth. Bring the baby to your breast. Make sure that much of the areola is in your baby's mouth. Offer one side and burp the baby before you offer the other side.  Talk with your health care provider or lactation consultant if you have questions or you face problems as you breastfeed. This information is not intended to replace advice given to you by your health care provider. Make sure you discuss any questions you have with your health care provider. Document Revised: 06/28/2017 Document Reviewed: 05/05/2016 Elsevier Patient Education  2020 [employer] Inc[employer]   Breast Pumping Tips There may be times when you cannot feed your baby from your breast, such as when you are at work or on a trip. Breast pumping allows you to remove milk from your breast in order to store for later use. There are three ways to pump. You can use:  Your hand to massage and squeeze your breast (hand expression).  A handheld manual pump.  An electric pump. When you first start to pump, you may not get much milk, but after a few days your breasts should start to make more. Pumping can  help stimulate your milk supply after your baby is born. It can also help maintain your milk supply when you are away from your baby. When should I pump? You can start pumping soon after your baby is born. Here are some tips on when to pump:  When with your baby: ? Pump after breastfeeding. ? Pump from the free breast while you breastfeed.  When away from your baby: ? Pump every 2-3 hours for about 15 minutes. ? Pump both breasts at the same time if you can.  If your baby gets formula feeding, pump around the time your baby gets that feeding.  If you drank alcohol, wait 2 hours before pumping.  If you are having a procedure with anesthesia, talk to your health care provider about when you should pump before and after. How do I prepare to pump? Take steps to relax. This makes it easier to stimulate your let-down reflex, which is what makes breast milk flow. To help:  Smell one of your infant's blankets or an item of clothing.  Look at a picture or video of your infant.  Sit in a quiet, private space.  Massage your breast and nipple.  Place a warm cloth on your breast. The cloth should be a little wet.  Play relaxing music.  Picture your milk flowing. What are some tips? General tips for pumping breast milk   Always wash your hands before pumping.  If you are not getting very much milk or pumping is uncomfortable, make adjustments to your pump or try using different type of pumps.  Drink enough  fluid to keep your urine clear or pale yellow.  Wear clothing that opens in the front or allows easy access to your breasts.  Pump breast milk directly into clean bottles or other storage containers.  Do not use any products that contain nicotine or tobacco, such as cigarettes and e-cigarettes. These can lower your milk supply and harm your infant. If you need help quitting, ask your health care provider. Tips for storing breast milk   Store breast milk in a clean, BPA-free  container, such as glass or plastic bottles or milk storage bags.  Store breast milk in 2-4 ounce batches to reduce waste.  Swirl the breast milk in the container to mix any cream that floats to the top. Do not shake it.  Label all stored milk with the date you pumped it.  The amount of time you can keep breast milk depends on where it is stored: ? Room temperature: 6-8 hours, if the milk is clean. It is best if used within 4 hours. ? Cooler with ice packs: 24 hours. ? Refrigerator: 5-8 days, if the milk is clean. It is best if used within 3 days. ? Freezer: 9-12 months, if the milk is clean and stored away from the freezer door. It is best if used within 6 months.  When using a refrigerator or freezer, put the milk in the back to keep it as cold as possible.  Thaw frozen milk using warm water. Do not use the microwave. Tips for choosing a breast pump The right pump for you will depend on your comfort and how often you will be away from your baby. When choosing a pump, consider the following:  Manual breast pumps do not need electricity to work. They are usually cheaper than electric pumps, but they can be harder to use. They may be a good choice if you are occasionally away from your baby.  Electric breast pumps are usually more expensive than manual pumps, but they can be easier for some women to use. They can also collect more milk than manual pumps. This makes them a good choice for women who work in an office or need to be away from their baby for longer periods of time.  The suction cup (flange) should be the right size. If it is the wrong size, it may cause pain and nipple damage.  Before buying a pump, find out whether your insurance covers the cost of a breast pump. Tips for maintaining a breast pump  Check your pump's manual for cleaning tips.  Clean the pump after each use. To do this: ? Wipe down the electrical unit. Use a dry, soft cloth or clean paper towel. Do not put  the electrical unit in water or cleaning products. ? Wash the plastic pump parts with soap and warm water or in the dishwasher, if the parts are dishwasher safe. You do not need to clean the tubing unless it comes in contact with breast milk. Let the parts air dry. Avoid drying them with a cloth or towel. ? When the pump parts are clean and dry, put the pump back together. Then store the pump.  If there is water in the tubing when it comes time to pump, attach the tubing to the pump and turn on the pump. Run the pump until the tube is dry.  Avoid touching the inside of pump parts that come in contact with breast milk. Summary  Pumping can help stimulate your milk supply after your  baby is born. It can also help maintain your milk supply when you are away from your baby.  When you are away from your infant for several hours, pump for about 15 minutes every 2-3 hours. Pump both breasts at the same time, if you can.  Your health care provider or lactation consultant can help you decide which breast pump is right for you. The right pump for you depends on your comfort, work schedule, and how often you may be away from your baby. This information is not intended to replace advice given to you by your health care provider. Make sure you discuss any questions you have with your health care provider. Document Revised: 07/24/2018 Document Reviewed: 05/08/2016 Elsevier Patient Education  2020 [employer].   Breastfeeding Tips for a Good Latch Latching is how your baby's mouth attaches to your nipple to breastfeed. It is an important part of breastfeeding. Your baby may have trouble latching for a number of reasons. A poor latch may cause you to have cracked or sore nipples or other problems. Follow these instructions at home: How to position your baby  Find a comfortable place to sit or lie down. Your neck and back should be well supported.  If you are seated, place a pillow or rolled-up blanket  under your baby. This will bring him or her to the level of your breast.  Make sure that your baby's belly (abdomen) is facing your belly.  Try different positions to find one that works best for you and your baby. How to help your baby latch   To start, gently rub your breast. Move your fingertips in a circle as you massage from your chest wall toward your nipple. This helps milk flow. Keep doing this during feeding if needed.  Position your breast. Hold your breast with four fingers underneath and your thumb above your nipple. Keep your fingers away from your nipple and your baby's mouth. Follow these steps to help your baby latch: 1. Rub your baby's lips gently with your finger or nipple. 2. When your baby's mouth is open wide enough, quickly bring your baby to your breast and place your whole nipple into your baby's mouth. Place as much of the colored area around your nipple (areola)as possible into your baby's mouth. 3. Your baby's tongue should be between his or her lower gum and your breast. 4. You should be able to see more areola above your baby's upper lip than below the lower lip. 5. When your baby starts sucking, you will feel a gentle pull on your nipple. You should not feel any pain. Be patient. It is common for a baby to suck for about 2-3 minutes to start the flow of breast milk. 6. Make sure that your baby's mouth is in the right position around your nipple. Your baby's lips should make a seal on your breast and be turned outward.  General instructions  Look for these signs that your baby has latched on to your nipple: ? The baby is quietly tugging or sucking without causing you pain. ? You hear the baby swallow after every 3 or 4 sucks. ? You see movement above and in front of the baby's ears while he or she is sucking.  Be aware of these signs that your baby has not latched on to your nipple: ? The baby makes sucking sounds or smacking sounds while feeding. ? You have  nipple pain.  If your baby is not latched well, put your  little finger between your baby's gums and your nipple. This will break the seal. Then try to help your baby latch again.  If you keep having problems, get help from a breastfeeding specialist (Advertising copywriterlactation consultant). Contact a doctor if:  You have cracking or soreness in your nipples that lasts longer than 1 week.  You have nipple pain.  Your breasts are filled with too much milk (engorgement), and this does not improve after 48-72 hours.  You have a plugged milk duct and a fever.  You follow the tips for a good latch but you keep having problems or concerns.  You have a pus-like fluid coming from your breast.  Your baby is not gaining weight.  Your baby loses weight. Summary  Latching is how your baby's mouth attaches to your nipple to breastfeed.  Try different positions for breastfeeding to find one that works best for you and your baby.  A poor latch may cause you to have cracked or sore nipples or other problems. This information is not intended to replace advice given to you by your health care provider. Make sure you discuss any questions you have with your health care provider. Document Revised: 07/24/2018 Document Reviewed: 11/08/2016 Elsevier Patient Education  2020 [employer]  Postpartum Baby Blues The postpartum period begins right after the birth of a baby. During this time, there is often a lot of joy and excitement. It is also a time of many changes in the life of the parents. No matter how many times a mother gives birth, each child brings new challenges to the family, including different ways of relating to one another. It is common to have feelings of excitement along with confusing changes in moods, emotions, and thoughts. You may feel happy one minute and sad or stressed the next. These feelings of sadness usually happen in the period right after you have your baby, and they go away within a week or  two. This is called the "baby blues." What are the causes? There is no known cause of baby blues. It is likely caused by a combination of factors. However, changes in hormone levels after childbirth are believed to trigger some of the symptoms. Other factors that can play a role in these mood changes include:  Lack of sleep.  Stressful life events, such as poverty, caring for a loved one, or death of a loved one.  Genetics. What are the signs or symptoms? Symptoms of this condition include:  Brief changes in mood, such as going from extreme happiness to sadness.  Decreased concentration.  Difficulty sleeping.  Crying spells and tearfulness.  Loss of appetite.  Irritability.  Anxiety. If the symptoms of baby blues last for more than 2 weeks or become more severe, you may have postpartum depression. How is this diagnosed? This condition is diagnosed based on an evaluation of your symptoms. There are no medical or lab tests that lead to a diagnosis, but there are various questionnaires that a health care provider may use to identify women with the baby blues or postpartum depression. How is this treated? Treatment is not needed for this condition. The baby blues usually go away on their own in 1-2 weeks. Social support is often all that is needed. You will be encouraged to get adequate sleep and rest. Follow these instructions at home: Lifestyle      Get as much rest as you can. Take a nap when the baby sleeps.  Exercise regularly as told by your health  care provider. Some women find yoga and walking to be helpful.  Eat a balanced and nourishing diet. This includes plenty of fruits and vegetables, whole grains, and lean proteins.  Do little things that you enjoy. Have a cup of tea, take a bubble bath, read your favorite magazine, or listen to your favorite music.  Avoid alcohol.  Ask for help with household chores, cooking, grocery shopping, or running errands. Do not try  to do everything yourself. Consider hiring a postpartum doula to help. This is a professional who specializes in providing support to new mothers.  Try not to make any major life changes during pregnancy or right after giving birth. This can add stress. General instructions  Talk to people close to you about how you are feeling. Get support from your partner, family members, friends, or other new moms. You may want to join a support group.  Find ways to cope with stress. This may include: ? Writing your thoughts and feelings in a journal. ? Spending time outside. ? Spending time with people who make you laugh.  Try to stay positive in how you think. Think about the things you are grateful for.  Take over-the-counter and prescription medicines only as told by your health care provider.  Let your health care provider know if you have any concerns.  Keep all postpartum visits as told by your health care provider. This is important. Contact a health care provider if:  Your baby blues do not go away after 2 weeks. Get help right away if:  You have thoughts of taking your own life (suicidal thoughts).  You think you may harm the baby or other people.  You see or hear things that are not there (hallucinations). Summary  After giving birth, you may feel happy one minute and sad or stressed the next. Feelings of sadness that happen right after the baby is born and go away after a week or two are called the "baby blues."  You can manage the baby blues by getting enough rest, eating a healthy diet, exercising, spending time with supportive people, and finding ways to cope with stress.  If feelings of sadness and stress last longer than 2 weeks or get in the way of caring for your baby, talk to your health care provider. This may mean you have postpartum depression. This information is not intended to replace advice given to you by your health care provider. Make sure you discuss any  questions you have with your health care provider. Document Revised: 07/26/2018 Document Reviewed: 05/30/2016 Elsevier Patient Education  2020 [employer].  Postpartum Care After Vaginal Delivery This sheet gives you information about how to care for yourself from the time you deliver your baby to up to 6-12 weeks after delivery (postpartum period). Your health care provider may also give you more specific instructions. If you have problems or questions, contact your health care provider. Follow these instructions at home: Vaginal bleeding  It is normal to have vaginal bleeding (lochia) after delivery. Wear a sanitary pad for vaginal bleeding and discharge. ? During the first week after delivery, the amount and appearance of lochia is often similar to a menstrual period. ? Over the next few weeks, it will gradually decrease to a dry, yellow-brown discharge. ? For most women, lochia stops completely by 4-6 weeks after delivery. Vaginal bleeding can vary from woman to woman.  Change your sanitary pads frequently. Watch for any changes in your flow, such as: ? A sudden  increase in volume. ? A change in color. ? Large blood clots.  If you pass a blood clot from your vagina, save it and call your health care provider to discuss. Do not flush blood clots down the toilet before talking with your health care provider.  Do not use tampons or douches until your health care provider says this is safe.  If you are not breastfeeding, your period should return 6-8 weeks after delivery. If you are feeding your child breast milk only (exclusive breastfeeding), your period may not return until you stop breastfeeding. Perineal care  Keep the area between the vagina and the anus (perineum) clean and dry as told by your health care provider. Use medicated pads and pain-relieving sprays and creams as directed.  If you had a cut in the perineum (episiotomy) or a tear in the vagina, check the area for signs  of infection until you are healed. Check for: ? More redness, swelling, or pain. ? Fluid or blood coming from the cut or tear. ? Warmth. ? Pus or a bad smell.  You may be given a squirt bottle to use instead of wiping to clean the perineum area after you go to the bathroom. As you start healing, you may use the squirt bottle before wiping yourself. Make sure to wipe gently.  To relieve pain caused by an episiotomy, a tear in the vagina, or swollen veins in the anus (hemorrhoids), try taking a warm sitz bath 2-3 times a day. A sitz bath is a warm water bath that is taken while you are sitting down. The water should only come up to your hips and should cover your buttocks. Breast care  Within the first few days after delivery, your breasts may feel heavy, full, and uncomfortable (breast engorgement). Milk may also leak from your breasts. Your health care provider can suggest ways to help relieve the discomfort. Breast engorgement should go away within a few days.  If you are breastfeeding: ? Wear a bra that supports your breasts and fits you well. ? Keep your nipples clean and dry. Apply creams and ointments as told by your health care provider. ? You may need to use breast pads to absorb milk that leaks from your breasts. ? You may have uterine contractions every time you breastfeed for up to several weeks after delivery. Uterine contractions help your uterus return to its normal size. ? If you have any problems with breastfeeding, work with your health care provider or Advertising copywriter.  If you are not breastfeeding: ? Avoid touching your breasts a lot. Doing this can make your breasts produce more milk. ? Wear a good-fitting bra and use cold packs to help with swelling. ? Do not squeeze out (express) milk. This causes you to make more milk. Intimacy and sexuality  Ask your health care provider when you can engage in sexual activity. This may depend on: ? Your risk of infection. ? How  fast you are healing. ? Your comfort and desire to engage in sexual activity.  You are able to get pregnant after delivery, even if you have not had your period. If desired, talk with your health care provider about methods of birth control (contraception). Medicines  Take over-the-counter and prescription medicines only as told by your health care provider.  If you were prescribed an antibiotic medicine, take it as told by your health care provider. Do not stop taking the antibiotic even if you start to feel better. Activity  Gradually return  to your normal activities as told by your health care provider. Ask your health care provider what activities are safe for you.  Rest as much as possible. Try to rest or take a nap while your baby is sleeping. Eating and drinking   Drink enough fluid to keep your urine pale yellow.  Eat high-fiber foods every day. These may help prevent or relieve constipation. High-fiber foods include: ? Whole grain cereals and breads. ? Brown rice. ? Beans. ? Fresh fruits and vegetables.  Do not try to lose weight quickly by cutting back on calories.  Take your prenatal vitamins until your postpartum checkup or until your health care provider tells you it is okay to stop. Lifestyle  Do not use any products that contain nicotine or tobacco, such as cigarettes and e-cigarettes. If you need help quitting, ask your health care provider.  Do not drink alcohol, especially if you are breastfeeding. General instructions  Keep all follow-up visits for you and your baby as told by your health care provider. Most women visit their health care provider for a postpartum checkup within the first 3-6 weeks after delivery. Contact a health care provider if:  You feel unable to cope with the changes that your child brings to your life, and these feelings do not go away.  You feel unusually sad or worried.  Your breasts become red, painful, or hard.  You have a  fever.  You have trouble holding urine or keeping urine from leaking.  You have little or no interest in activities you used to enjoy.  You have not breastfed at all and you have not had a menstrual period for 12 weeks after delivery.  You have stopped breastfeeding and you have not had a menstrual period for 12 weeks after you stopped breastfeeding.  You have questions about caring for yourself or your baby.  You pass a blood clot from your vagina. Get help right away if:  You have chest pain.  You have difficulty breathing.  You have sudden, severe leg pain.  You have severe pain or cramping in your lower abdomen.  You bleed from your vagina so much that you fill more than one sanitary pad in one hour. Bleeding should not be heavier than your heaviest period.  You develop a severe headache.  You faint.  You have blurred vision or spots in your vision.  You have bad-smelling vaginal discharge.  You have thoughts about hurting yourself or your baby. If you ever feel like you may hurt yourself or others, or have thoughts about taking your own life, get help right away. You can go to the nearest emergency department or call:  Your local emergency services (911 in the [place]  A suicide crisis helpline, such as the National Suicide Prevention Lifeline at (970)088-9463. This is open 24 hours a day. Summary  The period of time right after you deliver your newborn up to 6-12 weeks after delivery is called the postpartum period.  Gradually return to your normal activities as told by your health care provider.  Keep all follow-up visits for you and your baby as told by your health care provider. This information is not intended to replace advice given to you by your health care provider. Make sure you discuss any questions you have with your health care provider. Document Revised: 04/06/2017 Document Reviewed: 01/15/2017 Elsevier Patient Education  2020 [employer].

## 2019-09-17 NOTE — Lactation Note (Signed)
This note was copied from a baby's chart. Lactation Consultation Note  Patient Name: Tammy Barton SVXBL'T Date: 09/17/2019 Reason for consult: Follow-up assessment  LC follow-up before discharge. Feedings today have improved per mom, less pain, but still experimenting with positions that both mom and baby are comfortable with. Parents are encouraged with the feedings today, and confident in going home. LC encouraged use of coconut oil, expressed colostrum, and use of comfort gels for tender nipples, and soothing. Discussed benefits of these uses to help minimize bacterial growth and prevent clogged ducts and/or mastitis. Information given for breast/nipple care, breast fullness versus engorgement, and when to seek MD help.  Reviewed onset of mature milk, milk supply and demand, and growth spurts/cluster feedings. Encouraged to reach out to Lactation after discharge with questions, support, or outpatient services.  Maternal Data    Feeding Feeding Type: Breast Fed  LATCH Score                   Interventions Interventions: Breast feeding basics reviewed  Lactation Tools Discussed/Used     Consult Status Consult Status: Complete Date: 09/17/19 Follow-up type: Call as needed    [name] 09/17/2019, 4:24 PM

## 2019-09-17 NOTE — Discharge Summary (Signed)
Obstetric Discharge Summary  Patient ID: Tammy Barton MRN: [id number] DOB/AGE: 33-May-1987 33 y.o.   Date of Admission: 09/16/2019  Date of Discharge:   09/17/19  Admitting Diagnosis: Onset of Labor at [redacted]w[redacted]d  Secondary Diagnosis: Group beta strep positive, Reflux in pregnancy, AFI borderline low, Stress incontinence in female, Hereditary hemochromatosis  Mode of Delivery: Normal spontaneous vaginal delivery     Discharge Diagnosis: No other diagnosis   Intrapartum Procedures:  None   Post partum procedures: None  Complications: None   Brief Hospital Course   Zakiah Gauthreaux is a G2P1001 who had a spontaneous vaginal birth on 09/16/2019;  for further details, please refer to the delivey summary.  Patient had an uncomplicated postpartum course.  By time of discharge on PPD#1, her pain was controlled on oral pain medications; she had appropriate lochia and was ambulating, voiding without difficulty and tolerating regular diet.  She was deemed stable for discharge to home.    Labs:  CBC Latest Ref Rng & Units 09/17/2019 09/16/2019 08/01/2019  WBC 4.0 - 10.5 K/uL 13.3(H) 12.6(H) 7.6  Hemoglobin 12.0 - 15.0 g/dL 40.9 81.1 91.4  Hematocrit 36.0 - 46.0 % 38.2 40.3 39.4  Platelets 150 - 400 K/uL 198 202 214   O POS Performed at [facility], [address] [place], [place] [address]   Physical exam:   Temp:  [98 F (36.7 C)-98.5 F (36.9 C)] 98.3 F (36.8 C) (06/02 0210) Pulse Rate:  [72-90] 72 (06/02 0210) Resp:  [18-20] 20 (06/02 0210) BP: (107-134)/(46-81) 122/75 (06/02 0210) SpO2:  [96 %-99 %] 99 % (06/02 0210) Weight:  [70 kg] 70 kg (06/01 1043)  General: alert and no distress  Lochia: appropriate  Abdomen: soft, NT  Uterine Fundus: firm  Perineum: healing well, no significant drainage, no dehiscence, no significant erythema  Extremities: No evidence of DVT seen on physical exam. No lower extremity edema.  Edinburgh Postnatal Depression  Scale Screening Tool 09/17/2019 09/16/2019  I have been able to laugh and see the funny side of things. 0 (No Data)  I have looked forward with enjoyment to things. 0 -  I have blamed myself unnecessarily when things went wrong. 2 -  I have been anxious or worried for no good reason. 2 -  I have felt scared or panicky for no good reason. 1 -  Things have been getting on top of me. 1 -  I have been so unhappy that I have had difficulty sleeping. 0 -  I have felt sad or miserable. 1 -  I have been so unhappy that I have been crying. 0 -  The thought of harming myself has occurred to me. 0 -  Edinburgh Postnatal Depression Scale Total 7 -     Discharge Instructions: Per After Visit Summary.  Activity: Advance as tolerated. Pelvic rest for 6 weeks.  Also refer to After Visit Summary  Diet: Regular  Medications: Allergies as of 09/17/2019   No Known Allergies      Medication List     TAKE these medications    acetaminophen 325 MG tablet Commonly known as: Tylenol Take 2 tablets (650 mg total) by mouth every 4 (four) hours as needed (for pain scale < 4).   coconut oil Oil Apply 1 application topically as needed.   FISH OIL PO Take 200 mg by mouth. Take 2 tablets daily.   ibuprofen 600 MG tablet Commonly known as: ADVIL Take 1 tablet (600 mg total) by mouth every  6 (six) hours.   prenatal multivitamin Tabs tablet Take 1 tablet by mouth daily at 12 noon.   PROBIOTIC DAILY PO Take by mouth. Take 1 tablet daily.       Outpatient follow up:  Follow-up Information     Diona Fanti, CNM Follow up.   Specialties: Certified Nurse Midwife, Obstetrics and Gynecology, Radiology Why: Someone from the office will call you to schedule a two (2) week televisit and six (6) week postpartum visit.  Contact information: [address] [place] [address] [phone]           Postpartum contraception: abstinence; will discuss further at postpartum  visit  Discharged Condition: stable  Discharged to: home   Newborn Data:  Disposition:home with mother  Apgars: APGAR (1 MIN): 8   APGAR (5 MINS): 9     Baby Feeding: Breast   Diona Fanti, CNM [facility], [facility] 09/17/19 8:47 AM

## 2019-09-17 NOTE — Lactation Note (Signed)
This note was copied from a baby's chart. Lactation Consultation Note  Patient Name: Tammy Barton NOMVE'H Date: 09/17/2019 Reason for consult: Follow-up assessment;Term;Nipple pain/trauma  LC follow-up. Parents report cluster feeding early this morning, and baby cranky throughout the night. Voids/stools are above expectations for HOL/DOL.  Mom continues to report mild/mod discomfort with baby latching, appearance of pinched nipple after feedings despite assistance with positioning and latch from overnight RN. Mom has been using her own nipple cream and expressed colostrum for healing. Baby was resting comfortably in bassinet, but began to show early signs of waking. LC used gloved finger to perform a digit test of oral anatomy. Baby's tongue cups finger well, tongue extends beyond gum ridge with strong rhythmic sucking pattern, tongue can follow finger side to side well and is on roof of mouth at rest. LC observed upper lip, and found frenulum to the base of the upper gum ridge, possibly a tighter lip creating difficulty in flanging, possibly the cause of mom's discomfort.  Mom is anxious to work on baby's latch, declines the use of tools (believes it would be an added stressor), but requested information for someone who could look at her oral development: information given for 2 pediatric dentists in the area per mom's request. Baby began to show signs of hunger and was brought to mom. Mom had good placement and alignment of baby in football hold on the right breast. LC observed mom continuing to come to baby, instead of bringing baby to mom, with baby sucking on upper lip before latching. LC educated mom on bringing baby in towards the breast to ensure a deep latch with adequate intake of tissue, and wide open mouth before latch. Provided tips/strategies for flanging top and bottom lips post latch. With baby being brought to the breast at the second attempt with latching mom noted less  pain, and felt more of a tugging sensation. Baby fed for approximately 5 minutes before becoming sleepy and content lying skin to skin.  Parents plan to continue working on feedings today before going home. Plan is to call [facility] for next feeding.  Maternal Data Formula Feeding for Exclusion: No Has patient been taught Hand Expression?: Yes Does the patient have breastfeeding experience prior to this delivery?: Yes  Feeding Feeding Type: Breast Fed  LATCH Score Latch: Grasps breast easily, tongue down, lips flanged, rhythmical sucking.(top lip needed to be flanged out with assistance)  Audible Swallowing: A few with stimulation  Type of Nipple: Everted at rest and after stimulation  Comfort (Breast/Nipple): Filling, red/small blisters or bruises, mild/mod discomfort(discomfort per mom)  Hold (Positioning): No assistance needed to correctly position infant at breast.  LATCH Score: 8  Interventions Interventions: Breast feeding basics reviewed;Assisted with latch;Hand express;Support pillows;Position options  Lactation Tools Discussed/Used     Consult Status Consult Status: Follow-up Date: 09/17/19 Follow-up type: In-patient    Tammy Barton 09/17/2019, 10:35 AM

## 2019-09-18 ENCOUNTER — Other Ambulatory Visit: Payer: 59

## 2019-09-18 ENCOUNTER — Encounter: Payer: 59 | Admitting: Certified Nurse Midwife

## 2019-10-02 ENCOUNTER — Encounter: Payer: Self-pay | Admitting: Certified Nurse Midwife

## 2019-10-02 ENCOUNTER — Other Ambulatory Visit: Payer: Self-pay

## 2019-10-02 ENCOUNTER — Ambulatory Visit (INDEPENDENT_AMBULATORY_CARE_PROVIDER_SITE_OTHER): Payer: 59 | Admitting: Certified Nurse Midwife

## 2019-10-02 DIAGNOSIS — Z8659 Personal history of other mental and behavioral disorders: Secondary | ICD-10-CM | POA: Diagnosis not present

## 2019-10-02 DIAGNOSIS — Z1331 Encounter for screening for depression: Secondary | ICD-10-CM

## 2019-10-02 NOTE — Progress Notes (Signed)
A call was place to patient for a televisit. DOB as identifier. Patient states she is breast feeding and doing well. Her menstrual flow is scant. PhQ9=0. Call transferred to [facility] CNM.

## 2019-10-02 NOTE — Progress Notes (Signed)
Virtual Visit via Telephone Note  I connected with Tammy Barton on 10/02/19 at  4:15 PM EDT by telephone and verified that I am speaking with the correct person using two identifiers.  Location:  Patient: Tammy Barton (home)  Provider: Gunnar Barton, CNM ([facility], office)   I discussed the limitations, risks, security and privacy concerns of performing an evaluation and management service by telephone and the availability of in person appointments. I also discussed with the patient that there may be a patient responsible charge related to this service. The patient expressed understanding and agreed to proceed.   History of Present Illness:  Patient is two (2) weeks postpartum spontaneous vaginal birth.   Doing well overall. Spouse is working from home and spending time with oldest daughter.   History of postpartum anxiety. Aware of mood, being more intentional with actions and less scheduled oriented. Questions starting mediation now, because "doesn't want to feel how [name] felt last time".   Breastfeeding is going well. 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   Denies difficulty breathing or respiratory distress, chest pain, abdominal pain, excessive vaginal bleeding, dysuria, and leg pain or swelling.   Observations/Objective:  Depression screen [facility] 2/9 10/02/2019  Decreased Interest 0  Down, Depressed, Hopeless 0  PHQ - 2 Score 0  Altered sleeping 0  Tired, decreased energy 0  Change in appetite 0  Feeling bad or failure about yourself  0  Trouble concentrating 0  Moving slowly or fidgety/restless 0  Suicidal thoughts 0  PHQ-9 Score 0   Assessment and Plan:  Postpartum care and examination Lactating mother Depression screen negative History of  anxiety  Follow Up Instructions:  Follow up via MyChart for mood screen in two (2) weeks.   Encouraged to set appointment with therapist.   Reviewed red flag symptoms and when to call.   RTC x 4 weeks for PPV or sooner if needed.     I discussed the assessment and treatment plan with the patient. The patient was provided an opportunity to ask questions and all were answered. The patient agreed with the plan and demonstrated an understanding of the instructions.   The patient was advised to call back or seek an in-person evaluation if the symptoms worsen or if the condition fails to improve as anticipated.  I provided 20 nminutes of non-face-to-face time during this encounter.   Tammy Barton, CNM [facility], [facility] 10/02/19 4:16 PM

## 2019-10-02 NOTE — Patient Instructions (Signed)
Paroxetine tablets What is this medicine? PAROXETINE (pa ROX e teen) is used to treat depression. It may also be used to treat anxiety disorders, obsessive compulsive disorder, panic attacks, post traumatic stress, and premenstrual dysphoric disorder (PMDD). This medicine may be used for other purposes; ask your health care provider or pharmacist if you have questions. COMMON BRAND NAME(S): Paxil, Pexeva What should I tell my health care provider before I take this medicine? They need to know if you have any of these conditions:  bipolar disorder or a family history of bipolar disorder  bleeding disorders  glaucoma  heart disease  kidney disease  liver disease  low levels of sodium in the blood  seizures  suicidal thoughts, plans, or attempt; a previous suicide attempt by you or a family member  take MAOIs like Carbex, Eldepryl, Marplan, Nardil, and Parnate  take medicines that treat or prevent blood clots  thyroid disease  an unusual or allergic reaction to paroxetine, other medicines, foods, dyes, or preservatives  pregnant or trying to get pregnant  breast-feeding How should I use this medicine? Take this medicine by mouth with a glass of water. Follow the directions on the prescription label. You can take it with or without food. Take your medicine at regular intervals. Do not take your medicine more often than directed. Do not stop taking this medicine suddenly except upon the advice of your doctor. Stopping this medicine too quickly may cause serious side effects or your condition may worsen. A special MedGuide will be given to you by the pharmacist with each prescription and refill. Be sure to read this information carefully each time. Talk to your pediatrician regarding the use of this medicine in children. Special care may be needed. Overdosage: If you think you have taken too much of this medicine contact a poison control center or emergency room at once. NOTE:  This medicine is only for you. Do not share this medicine with others. What if I miss a dose? If you miss a dose, take it as soon as you can. If it is almost time for your next dose, take only that dose. Do not take double or extra doses. What may interact with this medicine? Do not take this medicine with any of the following medications:  linezolid  MAOIs like Carbex, Eldepryl, Marplan, Nardil, and Parnate  methylene blue (injected into a vein)  pimozide  thioridazine This medicine may also interact with the following medications:  alcohol  amphetamines  aspirin and aspirin-like medicines  atomoxetine  certain medicines for depression, anxiety, or psychotic disturbances  certain medicines for irregular heart beat like propafenone, flecainide, encainide, and quinidine  certain medicines for migraine headache like almotriptan, eletriptan, frovatriptan, naratriptan, rizatriptan, sumatriptan, zolmitriptan  cimetidine  digoxin  diuretics  fentanyl  fosamprenavir  furazolidone  isoniazid  lithium  medicines that treat or prevent blood clots like warfarin, enoxaparin, and dalteparin  medicines for sleep  NSAIDs, medicines for pain and inflammation, like ibuprofen or naproxen  phenobarbital  phenytoin  procarbazine  rasagiline  ritonavir  supplements like St. John's wort, kava kava, valerian  tamoxifen  tramadol  tryptophan This list may not describe all possible interactions. Give your health care provider a list of all the medicines, herbs, non-prescription drugs, or dietary supplements you use. Also tell them if you smoke, drink alcohol, or use illegal drugs. Some items may interact with your medicine. What should I watch for while using this medicine? Tell your doctor if your symptoms do not   get better or if they get worse. Visit your doctor or health care professional for regular checks on your progress. Because it may take several weeks to see  the full effects of this medicine, it is important to continue your treatment as prescribed by your doctor. Patients and their families should watch out for new or worsening thoughts of suicide or depression. Also watch out for sudden changes in feelings such as feeling anxious, agitated, panicky, irritable, hostile, aggressive, impulsive, severely restless, overly excited and hyperactive, or not being able to sleep. If this happens, especially at the beginning of treatment or after a change in dose, call your health care professional. Dennis Bast may get drowsy or dizzy. Do not drive, use machinery, or do anything that needs mental alertness until you know how this medicine affects you. Do not stand or sit up quickly, especially if you are an older patient. This reduces the risk of dizzy or fainting spells. Alcohol may interfere with the effect of this medicine. Avoid alcoholic drinks. Your mouth may get dry. Chewing sugarless gum or sucking hard candy, and drinking plenty of water will help. Contact your doctor if the problem does not go away or is severe. What side effects may I notice from receiving this medicine? Side effects that you should report to your doctor or health care professional as soon as possible:  allergic reactions like skin rash, itching or hives, swelling of the face, lips, or tongue  anxious  black, tarry stools  changes in vision  confusion  elevated mood, decreased need for sleep, racing thoughts, impulsive behavior  eye pain  fast, irregular heartbeat  feeling faint or lightheaded, falls  feeling agitated, angry, or irritable  hallucination, loss of contact with reality  loss of balance or coordination  loss of memory  painful or prolonged erections  restlessness, pacing, inability to keep still  seizures  stiff muscles  suicidal thoughts or other mood changes  trouble sleeping  unusual bleeding or bruising  unusually weak or tired  vomiting Side  effects that usually do not require medical attention (report to your doctor or health care professional if they continue or are bothersome):  change in appetite or weight  change in sex drive or performance  diarrhea  dizziness  dry mouth  increased sweating  indigestion, nausea  tired  tremors This list may not describe all possible side effects. Call your doctor for medical advice about side effects. You may report side effects to FDA at 1-800-FDA-1088. Where should I keep my medicine? Keep out of the reach of children. Store at room temperature between 15 and 30 degrees C (59 and 86 degrees F). Keep container tightly closed. Throw away any unused medicine after the expiration date. NOTE: This sheet is a summary. It may not cover all possible information. If you have questions about this medicine, talk to your doctor, pharmacist, or health care provider.  2020 Elsevier/Gold Standard (2015-09-04 15:50:32)   Perinatal Anxiety When a woman feels excessive tension or worry (anxiety) during pregnancy or during the first 12 months after she gives birth, she has a condition called perinatal anxiety. Anxiety can interfere with work, school, relationships, and other everyday activities. If it is not managed properly, it can also cause problems in the mother and her baby.  If you are pregnant and you have symptoms of an anxiety disorder, it is important to talk with your health care provider. What are the causes? The exact cause of this condition is not known.  Hormonal changes during and after pregnancy may play a role in causing perinatal anxiety. What increases the risk? You are more likely to develop this condition if:  You have a personal or family history of depression, anxiety, or mood disorders.  You experience a stressful life event during pregnancy, such as the death of a loved one.  You have a lot of regular life stress, such as being a single parent.  You have thyroid  problems. What are the signs or symptoms? Perinatal anxiety can be different for everyone. It may include:  Panic attacks (panic disorder). These are intense episodes of fear or discomfort that may also cause sweating, nausea, shortness of breath, or fear of dying. They usually last 5-15 minutes.  Reliving an upsetting (traumatic) event through distressing thoughts, dreams, or flashbacks (post-traumatic stress disorder, or PTSD).  Excessive worry about multiple problems (generalized anxiety disorder).  Fear and stress about leaving certain people or loved ones (separation anxiety).  Performing repetitive tasks (compulsions) to relieve stress or worry (obsessive compulsive disorder, or OCD).  Fear of certain objects or situations (phobias).  Excessive worrying, such as a constant feeling that something bad is going to happen.  Inability to relax.  Difficulty concentrating.  Sleep problems.  Frequent nightmares or disturbing thoughts. How is this diagnosed? This condition is diagnosed based on a physical exam and mental evaluation. In some cases, your health care provider may use an anxiety screening tool. These tools include a list of questions that can help a health care provider diagnose anxiety. Your health care provider may refer you to a mental health expert who specializes in anxiety. How is this treated? This condition may be treated with:  Medicines. Your health care provider will only give you medicines that have been proven safe for pregnancy and breastfeeding.  Talk therapy with a mental health professional to help change your patterns of thinking (cognitive behavioral therapy).  Mindfulness-based stress reduction.  Other relaxation therapies, such as deep breathing or guided muscle relaxation.  Support groups. Follow these instructions at home: Lifestyle  Do not use any products that contain nicotine or tobacco, such as cigarettes and e-cigarettes. If you need  help quitting, ask your health care provider.  Do not use alcohol when you are pregnant. After your baby is born, limit alcohol intake to no more than 1 drink a day. One drink equals 12 oz of beer, 5 oz of wine, or 1 oz of hard liquor.  Consider joining a support group for new mothers. Ask your health care provider for recommendations.  Take good care of yourself. Make sure you: ? Get plenty of sleep. If you are having trouble sleeping, talk with your health care provider. ? Eat a healthy diet. This includes plenty of fruits and vegetables, whole grains, and lean proteins. ? Exercise regularly, as told by your health care provider. Ask your health care provider what exercises are safe for you. General instructions  Take over-the-counter and prescription medicines only as told by your health care provider.  Talk with your partner or family members about your feelings during pregnancy. Share any concerns or fears that you may have.  Ask for help with tasks or chores when you need it. Ask friends and family members to provide meals, watch your children, or help with cleaning.  Keep all follow-up visits as told by your health care provider. This is important. Contact a health care provider if:  You (or people close to you) notice that you have any  symptoms of anxiety or depression.  You have anxiety and your symptoms get worse.  You experience side effects from medicines, such as nausea or sleep problems. Get help right away if:  You feel like hurting yourself, your baby, or someone else. If you ever feel like you may hurt yourself or others, or have thoughts about taking your own life, get help right away. You can go to your nearest emergency department or call:  Your local emergency services (911 in the [place]  A suicide crisis helpline, such as the National Suicide Prevention Lifeline at 1-800-273-8255. This is open 24 hours a day. Summary  Perinatal anxiety is when a woman feels  excessive tension or worry during pregnancy or during the first 12 months after she gives birth.  Perinatal anxiety may include panic attacks, post-traumatic stress disorder, separation anxiety, phobias, or generalized anxiety.  Perinatal anxiety can cause physical health problems in the mother and baby if not properly managed.  This condition is treated with medicines, talk therapy, stress reduction therapies, or a combination of two or more treatments.  Talk with your partner or family members about your concerns or fears. Do not be afraid to ask for help. This information is not intended to replace advice given to you by your health care provider. Make sure you discuss any questions you have with your health care provider. Document Revised: 04/06/2017 Document Reviewed: 05/31/2016 Elsevier Patient Education  2020 Elsevier Inc.  

## 2019-10-30 ENCOUNTER — Encounter: Payer: Self-pay | Admitting: Certified Nurse Midwife

## 2019-10-30 ENCOUNTER — Other Ambulatory Visit: Payer: Self-pay

## 2019-10-30 ENCOUNTER — Ambulatory Visit (INDEPENDENT_AMBULATORY_CARE_PROVIDER_SITE_OTHER): Payer: 59 | Admitting: Certified Nurse Midwife

## 2019-10-30 DIAGNOSIS — Z1331 Encounter for screening for depression: Secondary | ICD-10-CM

## 2019-10-30 DIAGNOSIS — Z8659 Personal history of other mental and behavioral disorders: Secondary | ICD-10-CM

## 2019-10-30 NOTE — Patient Instructions (Signed)
Preventive Care 21-33 Years Old, Female Preventive care refers to visits with your health care provider and lifestyle choices that can promote health and wellness. This includes:  A yearly physical exam. This may also be called an annual well check.  Regular dental visits and eye exams.  Immunizations.  Screening for certain conditions.  Healthy lifestyle choices, such as eating a healthy diet, getting regular exercise, not using drugs or products that contain nicotine and tobacco, and limiting alcohol use. What can I expect for my preventive care visit? Physical exam Your health care provider will check your:  Height and weight. This may be used to calculate body mass index (BMI), which tells if you are at a healthy weight.  Heart rate and blood pressure.  Skin for abnormal spots. Counseling Your health care provider may ask you questions about your:  Alcohol, tobacco, and drug use.  Emotional well-being.  Home and relationship well-being.  Sexual activity.  Eating habits.  Work and work environment.  Method of birth control.  Menstrual cycle.  Pregnancy history. What immunizations do I need?  Influenza (flu) vaccine  This is recommended every year. Tetanus, diphtheria, and pertussis (Tdap) vaccine  You may need a Td booster every 10 years. Varicella (chickenpox) vaccine  You may need this if you have not been vaccinated. Human papillomavirus (HPV) vaccine  If recommended by your health care provider, you may need three doses over 6 months. Measles, mumps, and rubella (MMR) vaccine  You may need at least one dose of MMR. You may also need a second dose. Meningococcal conjugate (MenACWY) vaccine  One dose is recommended if you are age 19-21 years and a first-year college student living in a residence hall, or if you have one of several medical conditions. You may also need additional booster doses. Pneumococcal conjugate (PCV13) vaccine  You may need  this if you have certain conditions and were not previously vaccinated. Pneumococcal polysaccharide (PPSV23) vaccine  You may need one or two doses if you smoke cigarettes or if you have certain conditions. Hepatitis A vaccine  You may need this if you have certain conditions or if you travel or work in places where you may be exposed to hepatitis A. Hepatitis B vaccine  You may need this if you have certain conditions or if you travel or work in places where you may be exposed to hepatitis B. Haemophilus influenzae type b (Hib) vaccine  You may need this if you have certain conditions. You may receive vaccines as individual doses or as more than one vaccine together in one shot (combination vaccines). Talk with your health care provider about the risks and benefits of combination vaccines. What tests do I need?  Blood tests  Lipid and cholesterol levels. These may be checked every 5 years starting at age 20.  Hepatitis C test.  Hepatitis B test. Screening  Diabetes screening. This is done by checking your blood sugar (glucose) after you have not eaten for a while (fasting).  Sexually transmitted disease (STD) testing.  BRCA-related cancer screening. This may be done if you have a family history of breast, ovarian, tubal, or peritoneal cancers.  Pelvic exam and Pap test. This may be done every 3 years starting at age 21. Starting at age 30, this may be done every 5 years if you have a Pap test in combination with an HPV test. Talk with your health care provider about your test results, treatment options, and if necessary, the need for more tests.   Follow these instructions at home: Eating and drinking   Eat a diet that includes fresh fruits and vegetables, whole grains, lean protein, and low-fat dairy.  Take vitamin and mineral supplements as recommended by your health care provider.  Do not drink alcohol if: ? Your health care provider tells you not to drink. ? You are  pregnant, may be pregnant, or are planning to become pregnant.  If you drink alcohol: ? Limit how much you have to 0-1 drink a day. ? Be aware of how much alcohol is in your drink. In the [place] one drink equals one 12 oz bottle of beer (355 mL), one 5 oz glass of wine (148 mL), or one 1 oz glass of hard liquor (44 mL). Lifestyle  Take daily care of your teeth and gums.  Stay active. Exercise for at least 30 minutes on 5 or more days each week.  Do not use any products that contain nicotine or tobacco, such as cigarettes, e-cigarettes, and chewing tobacco. If you need help quitting, ask your health care provider.  If you are sexually active, practice safe sex. Use a condom or other form of birth control (contraception) in order to prevent pregnancy and STIs (sexually transmitted infections). If you plan to become pregnant, see your health care provider for a preconception visit. What's next?  Visit your health care provider once a year for a well check visit.  Ask your health care provider how often you should have your eyes and teeth checked.  Stay up to date on all vaccines. This information is not intended to replace advice given to you by your health care provider. Make sure you discuss any questions you have with your health care provider. Document Revised: 12/13/2017 Document Reviewed: 12/13/2017 Elsevier Patient Education  2020 [employer].

## 2019-11-02 NOTE — Progress Notes (Signed)
Subjective:    Tammy Barton is a 34 y.o. G16P2002 Caucasian female who presents for a postpartum visit. She is 6 weeks postpartum following a spontaneous vaginal delivery at 40+2 gestational weeks. Anesthesia: none. I have fully reviewed the prenatal and intrapartum course.   Postpartum course has been uncomplicated; history anxiety.   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 Baby is feeding by breast.   Bleeding no bleeding. Bowel function is normal. Bladder function is normal.   Patient is not sexually active. Contraception method is NFP.   Postpartum depression screening: negative. Score 0.    Last pap 03/03/2019 and was Negative/Negative.  Denies difficulty breathing or respiratory distress, chest pain, abdominal pain, excessive vaginal bleeding, dysuria, and leg pain or swelling.     The following portions of the patient's history were reviewed and updated as appropriate: allergies, current medications, past medical history, past surgical history and problem list.  Review of Systems  Pertinent items are noted in HPI.   Objective:   BP (!) 80/43    Pulse 71    Ht 5\' 5"  (1.651 m)    Breastfeeding Yes    BMI 25.68 kg/m   General:  alert, cooperative and no distress   Breasts:  deferred, no complaints  Lungs: clear to auscultation bilaterally  Heart:  regular rate and rhythm  Abdomen: soft, nontender   Vulva: normal  Vagina: normal vagina  Cervix:  closed  Corpus: Well-involuted  Adnexa:  Non-palpable  Rectal Exam: Single external hemorrhoids   Depression screen [facility] 2/9 10/30/2019 10/02/2019  Decreased Interest 0 0  Down, Depressed, Hopeless 0 0  PHQ - 2 Score 0 0  Altered sleeping 0 0  Tired, decreased energy 0 0  Change in appetite 0 0  Feeling bad or failure about yourself  0 0  Trouble concentrating 0 0  Moving slowly or fidgety/restless 0 0  Suicidal thoughts 0 0  PHQ-9 Score 0 0          Assessment:   Postpartum exam Six (6) wks s/p spontaneous vaginal birth Breastfeeding Depression screening Contraception counseling   Plan:   Encouraged routine health maintenance techniques.   Reviewed red flag symptoms and when to call.   Follow up in: 4 months for ANNUAL EXAM or earlier if needed   10/04/2019, CNM [facility], [facility]

## 2020-12-02 ENCOUNTER — Encounter: Payer: Self-pay | Admitting: Adult Health

## 2021-01-03 ENCOUNTER — Ambulatory Visit: Payer: 59 | Admitting: Adult Health

## 2021-03-15 ENCOUNTER — Ambulatory Visit (INDEPENDENT_AMBULATORY_CARE_PROVIDER_SITE_OTHER): Payer: 59 | Admitting: Family Medicine

## 2021-03-15 ENCOUNTER — Other Ambulatory Visit: Payer: Self-pay

## 2021-03-15 ENCOUNTER — Encounter: Payer: Self-pay | Admitting: Family Medicine

## 2021-03-15 VITALS — BP 96/66 | HR 83 | Ht 65.0 in | Wt 165.4 lb

## 2021-03-15 DIAGNOSIS — Z6827 Body mass index (BMI) 27.0-27.9, adult: Secondary | ICD-10-CM | POA: Diagnosis not present

## 2021-03-15 DIAGNOSIS — Z7689 Persons encountering health services in other specified circumstances: Secondary | ICD-10-CM | POA: Diagnosis not present

## 2021-03-15 DIAGNOSIS — R7309 Other abnormal glucose: Secondary | ICD-10-CM

## 2021-03-15 DIAGNOSIS — Z0000 Encounter for general adult medical examination without abnormal findings: Secondary | ICD-10-CM

## 2021-03-15 NOTE — Progress Notes (Signed)
Subjective:    Patient ID: Tammy Barton, female    DOB: 08/06/1985, 35 y.o.   MRN: [id number]  Tammy Barton is a 35 y.o. female presenting on 03/15/2021 for Establish Care  Previously with OBGYN. Here to establish care with new PCP.  HPI  Hemochromatosis Hereditary condition Previous followed by H[facility]. Last anemia panel done 2020. She was discharged by them and has done well. She has done some phlebotomy at one point in time. - She had elevated Tsat. And question iron levels - She said that as long as Ferritin was in range she would be.  Elevated Blood Sugar, Impaired Fasting Due for lab test She has history of some elevated sugars.  Admits life stressors lately but not diagnosed with anxiety. Says it is not impacting her function.  Lifestyle She is adjusting to post-pregnancy period after 2nd child now almost at 3 yr. Diet: balanced diet Exercise:  goal to improve back on regimen, joined gym   GYN History 2 girls, about to be 18 months, and oldest is 4 years. Her youngest daughter just had surgery recently.  Health Maintenance: Abnormal fibroadenoma breast biopsy 2015, determined benign. No repeat. fam history breast cancer grandparent age 34+  UTD Flu Shot already  [facility] 03/15/2021 03/15/2021 10/30/2019  Decreased Interest 0 0 0  Down, Depressed, Hopeless 1 1 0  PHQ - 2 Score 1 1 0  Altered sleeping 0 0 0  Tired, decreased energy 2 2 0  Change in appetite 0 0 0  Feeling bad or failure about yourself  0 0 0  Trouble concentrating 0 0 0  Moving slowly or fidgety/restless 0 0 0  Suicidal thoughts 0 0 0  PHQ-9 Score 3 3 0  Difficult doing work/chores Not difficult at all Not difficult at all -     GAD 7 : Generalized Anxiety Score 03/15/2021 03/15/2021  Nervous, Anxious, on Edge 1 1  Control/stop worrying 1 1  Worry too much - different things 1 1  Trouble relaxing 1 1  Restless 0 0  Easily annoyed or  irritable 1 1  Afraid - awful might happen 1 1  Total GAD 7 Score 6 6  Anxiety Difficulty Not difficult at all Not difficult at all     Past Medical History:  Diagnosis Date   Iron overload    Past Surgical History:  Procedure Laterality Date   BREAST SURGERY     left breast mass, biopsy, benign      Social History   Socioeconomic History   Marital status: Married    Spouse name: Not on file   Number of children: Not on file   Years of education: Not on file   Highest education level: Not on file  Occupational History   Not on file  Tobacco Use   Smoking status: Never   Smokeless tobacco: Never  Vaping Use   Vaping Use: Never used  Substance and Sexual Activity   Alcohol use: Yes    Alcohol/week: 1.0 - 2.0 standard drink    Types: 1 - 2 Standard drinks or equivalent per week   Drug use: No   Sexual activity: Yes    Birth control/protection: None  Other Topics Concern   Not on file  Social History Narrative   Not on file   Social Determinants of Health   Financial Resource Strain: Not on file  Food Insecurity: Not on file  Transportation Needs: Not on file  Physical Activity: Not on file  Stress: Not on file  Social Connections: Not on file  Intimate Partner Violence: Not on file   Family History  Problem Relation Age of Onset   Diabetes Paternal Grandmother    Current Outpatient Medications on File Prior to Visit  Medication Sig   Omega-3 Fatty Acids (FISH OIL PO) Take 200 mg by mouth. Take 2 tablets daily. (Patient not taking: Reported on 03/15/2021)   Prenatal Vit-Fe Fumarate-FA (PRENATAL MULTIVITAMIN) TABS tablet Take 1 tablet by mouth daily at 12 noon. (Patient not taking: Reported on 03/15/2021)   Probiotic Product (PROBIOTIC DAILY PO) Take by mouth. Take 1 tablet daily. (Patient not taking: Reported on 03/15/2021)   No current facility-administered medications on file prior to visit.    Review of Systems  Constitutional:  Negative for activity  change, appetite change, chills, diaphoresis, fatigue and fever.  HENT:  Negative for congestion and hearing loss.   Eyes:  Negative for visual disturbance.  Respiratory:  Negative for cough, chest tightness, shortness of breath and wheezing.   Cardiovascular:  Negative for chest pain, palpitations and leg swelling.  Gastrointestinal:  Negative for abdominal pain, constipation, diarrhea, nausea and vomiting.  Genitourinary:  Negative for dysuria, frequency and hematuria.  Musculoskeletal:  Negative for arthralgias and neck pain.  Skin:  Negative for rash.  Neurological:  Negative for dizziness, weakness, light-headedness, numbness and headaches.  Hematological:  Negative for adenopathy.  Psychiatric/Behavioral:  Negative for behavioral problems, dysphoric mood and sleep disturbance.   Per HPI unless specifically indicated above     Objective:    BP 96/66   Pulse 83   Ht 5\' 5"  (1.651 m)   Wt 165 lb 6.4 oz (75 kg)   SpO2 98%   Breastfeeding No   BMI 27.52 kg/m   Wt Readings from Last 3 Encounters:  03/15/21 165 lb 6.4 oz (75 kg)  09/16/19 154 lb 5.2 oz (70 kg)  09/09/19 164 lb 5 oz (74.5 kg)    Physical Exam Vitals and nursing note reviewed.  Constitutional:      General: She is not in acute distress.    Appearance: She is well-developed. She is not diaphoretic.     Comments: Well-appearing, comfortable, cooperative  HENT:     Head: Normocephalic and atraumatic.  Eyes:     General:        Right eye: No discharge.        Left eye: No discharge.     Conjunctiva/sclera: Conjunctivae normal.     Pupils: Pupils are equal, round, and reactive to light.  Neck:     Thyroid: No thyromegaly.  Cardiovascular:     Rate and Rhythm: Normal rate and regular rhythm.     Pulses: Normal pulses.     Heart sounds: Normal heart sounds. No murmur heard. Pulmonary:     Effort: Pulmonary effort is normal. No respiratory distress.     Breath sounds: Normal breath sounds. No wheezing or  rales.  Abdominal:     General: Bowel sounds are normal. There is no distension.     Palpations: Abdomen is soft. There is no mass.     Tenderness: There is no abdominal tenderness.  Musculoskeletal:        General: No tenderness. Normal range of motion.     Cervical back: Normal range of motion and neck supple.     Comments: Upper / Lower Extremities: - Normal muscle tone, strength bilateral upper extremities 5/5, lower extremities 5/5  Lymphadenopathy:     Cervical: No cervical adenopathy.  Skin:    General: Skin is warm and dry.     Findings: No erythema or rash.  Neurological:     Mental Status: She is alert and oriented to person, place, and time.     Comments: Distal sensation intact to light touch all extremities  Psychiatric:        Mood and Affect: Mood normal.        Behavior: Behavior normal.        Thought Content: Thought content normal.     Comments: Well groomed, good eye contact, normal speech and thoughts   Results for orders placed or performed during the hospital encounter of 09/16/19  SARS Coronavirus 2 by RT PCR (hospital order, performed in [place] hospital lab) Nasopharyngeal Nasopharyngeal Swab   Specimen: Nasopharyngeal Swab  Result Value Ref Range   SARS Coronavirus 2 NEGATIVE NEGATIVE  CBC  Result Value Ref Range   WBC 12.6 (H) 4.0 - 10.5 K/uL   RBC 4.09 3.87 - 5.11 MIL/uL   Hemoglobin 14.1 12.0 - 15.0 g/dL   HCT 40.3 36.0 - 46.0 %   MCV 98.5 80.0 - 100.0 fL   MCH 34.5 (H) 26.0 - 34.0 pg   MCHC 35.0 30.0 - 36.0 g/dL   RDW 12.1 11.5 - 15.5 %   Platelets 202 150 - 400 K/uL   nRBC 0.0 0.0 - 0.2 %  RPR  Result Value Ref Range   RPR Ser Ql NON REACTIVE NON REACTIVE  CBC  Result Value Ref Range   WBC 13.3 (H) 4.0 - 10.5 K/uL   RBC 3.93 3.87 - 5.11 MIL/uL   Hemoglobin 13.7 12.0 - 15.0 g/dL   HCT 38.2 36.0 - 46.0 %   MCV 97.2 80.0 - 100.0 fL   MCH 34.9 (H) 26.0 - 34.0 pg   MCHC 35.9 30.0 - 36.0 g/dL   RDW 12.3 11.5 - 15.5 %   Platelets 198  150 - 400 K/uL   nRBC 0.0 0.0 - 0.2 %  Rapid HIV screen [facility] L&D dept ONLY)  Result Value Ref Range   HIV-1 P24 Antigen - HIV24 NON REACTIVE NON REACTIVE   HIV 1/2 Antibodies NON REACTIVE NON REACTIVE   Interpretation (HIV Ag Ab)      A non reactive test result means that HIV 1 or HIV 2 antibodies and HIV 1 p24 antigen were not detected in the specimen.  Type and screen [place]  Result Value Ref Range   ABO/RH(D) O POS    Antibody Screen NEG    Sample Expiration      09/19/2019,2359 Performed at [facility], [address] [place], [place] [address]   ABO/Rh  Result Value Ref Range   ABO/RH(D)      O POS Performed at [facility], [address] [place], [place] [address]       Assessment & Plan:   Problem List Items Addressed This Visit     Hereditary hemochromatosis ([place])   Relevant Orders   CBC with Differential/Platelet   Iron, TIBC and Ferritin Panel   Other Visit Diagnoses     Annual physical exam    -  Primary   Relevant Orders   COMPLETE METABOLIC PANEL WITH GFR   Lipid panel   CBC with Differential/Platelet   Encounter to establish care with new doctor       BMI 27.0-27.9,adult  Relevant Orders   COMPLETE METABOLIC PANEL WITH GFR   Lipid panel   Hemoglobin A1c   Abnormal glucose       Relevant Orders   Hemoglobin A1c       Review records on EHR past medical information / chart review Updated Health Maintenance information Reviewed recent lab results with patient Encouraged improvement to lifestyle with diet and exercise Goal of weight loss / maintain healthy weight.  #Hereditary Hemochromatosis Chronic condition Previously w/ [facility] Hematology Dr Grayland Ormond Check Iron panel with ferritin to monitor status Will review results with her and consider curbside consult to Hematology based on results if she needs further follow up or intervention. Will also check A1c, history of elevated in past can be  assocaited   No orders of the defined types were placed in this encounter.     Follow up plan: Return in about 2 days (around 03/17/2021) for 2 days Thursday 12/1 for AM fasting lab only.  Future orders 2 days for Anemia Panel, CBC, A1c, CMET, Lipid  Nobie Putnam, DO [facility] 03/15/2021, 10:41 AM

## 2021-03-15 NOTE — Patient Instructions (Addendum)
Thank you for coming to the office today.  After lab results, stay tuned we can correspond on mychart and if there are significant questions on the iron panel - then we can always curbside or contact Dr Orlie Dakin for advice.  DUE for FASTING BLOOD WORK (no food or drink after midnight before the lab appointment, only water or coffee without cream/sugar on the morning of)  SCHEDULE "Lab Only" visit in the morning at the clinic for lab draw - later this week  - Make sure Lab Only appointment is at about 1 week before your next appointment, so that results will be available  For Lab Results, once available within 2-3 days of blood draw, you can can log in to MyChart online to view your results and a brief explanation. Also, we can discuss results at next follow-up visit.   Please schedule a Follow-up Appointment to: Return in about 2 days (around 03/17/2021) for 2 days Thursday 12/1 for AM fasting lab only.  If you have any other questions or concerns, please feel free to call the office or send a message through MyChart. You may also schedule an earlier appointment if necessary.  Additionally, you may be receiving a survey about your experience at our office within a few days to 1 week by e-mail or mail. We value your feedback.  Saralyn Pilar, DO [facility], [place]

## 2021-03-16 ENCOUNTER — Other Ambulatory Visit: Payer: Self-pay

## 2021-03-16 DIAGNOSIS — Z6827 Body mass index (BMI) 27.0-27.9, adult: Secondary | ICD-10-CM

## 2021-03-16 DIAGNOSIS — Z0000 Encounter for general adult medical examination without abnormal findings: Secondary | ICD-10-CM

## 2021-03-16 DIAGNOSIS — R7309 Other abnormal glucose: Secondary | ICD-10-CM

## 2021-03-17 ENCOUNTER — Other Ambulatory Visit: Payer: Self-pay

## 2021-03-17 ENCOUNTER — Other Ambulatory Visit: Payer: 59

## 2021-03-18 ENCOUNTER — Encounter: Payer: Self-pay | Admitting: Family Medicine

## 2021-03-18 LAB — IRON,TIBC AND FERRITIN PANEL
%SAT: 65 % (calc) — ABNORMAL HIGH (ref 16–45)
Ferritin: 33 ng/mL (ref 16–154)
Iron: 147 ug/dL (ref 40–190)
TIBC: 227 mcg/dL (calc) — ABNORMAL LOW (ref 250–450)

## 2021-03-18 LAB — HEMOGLOBIN A1C
Hgb A1c MFr Bld: 5.1 % of total Hgb (ref <5.7)
Mean Plasma Glucose: 100 mg/dL
eAG (mmol/L): 5.5 mmol/L

## 2021-03-18 LAB — CBC WITH DIFFERENTIAL/PLATELET
Absolute Monocytes: 432 cells/uL (ref 200–950)
Basophils Absolute: 52 cells/uL (ref 0–200)
Basophils Relative: 1 %
Eosinophils Absolute: 270 cells/uL (ref 15–500)
Eosinophils Relative: 5.2 %
HCT: 42.6 % (ref 35.0–45.0)
Hemoglobin: 14.5 g/dL (ref 11.7–15.5)
Lymphs Abs: 1981 cells/uL (ref 850–3900)
MCH: 33.3 pg — ABNORMAL HIGH (ref 27.0–33.0)
MCHC: 34 g/dL (ref 32.0–36.0)
MCV: 97.7 fL (ref 80.0–100.0)
MPV: 10.1 fL (ref 7.5–12.5)
Monocytes Relative: 8.3 %
Neutro Abs: 2465 cells/uL (ref 1500–7800)
Neutrophils Relative %: 47.4 %
Platelets: 257 10*3/uL (ref 140–400)
RBC: 4.36 10*6/uL (ref 3.80–5.10)
RDW: 11.6 % (ref 11.0–15.0)
Total Lymphocyte: 38.1 %
WBC: 5.2 10*3/uL (ref 3.8–10.8)

## 2021-03-18 LAB — COMPLETE METABOLIC PANEL WITH GFR
AG Ratio: 1.6 (calc) (ref 1.0–2.5)
ALT: 18 U/L (ref 6–29)
AST: 20 U/L (ref 10–30)
Albumin: 4.2 g/dL (ref 3.6–5.1)
Alkaline phosphatase (APISO): 65 U/L (ref 31–125)
BUN/Creatinine Ratio: 7 (calc) (ref 6–22)
BUN: 13 mg/dL (ref 7–25)
CO2: 28 mmol/L (ref 20–32)
Calcium: 9.1 mg/dL (ref 8.6–10.2)
Chloride: 105 mmol/L (ref 98–110)
Creat: 1.85 mg/dL — ABNORMAL HIGH (ref 0.50–0.97)
Globulin: 2.6 g/dL (calc) (ref 1.9–3.7)
Glucose, Bld: 94 mg/dL (ref 65–99)
Potassium: 4.6 mmol/L (ref 3.5–5.3)
Sodium: 141 mmol/L (ref 135–146)
Total Bilirubin: 0.4 mg/dL (ref 0.2–1.2)
Total Protein: 6.8 g/dL (ref 6.1–8.1)
eGFR: 36 mL/min/{1.73_m2} — ABNORMAL LOW (ref >=60)

## 2021-03-18 LAB — LIPID PANEL
Cholesterol: 214 mg/dL — ABNORMAL HIGH (ref <200)
HDL: 48 mg/dL — ABNORMAL LOW (ref >=50)
LDL Cholesterol (Calc): 141 mg/dL (calc) — ABNORMAL HIGH
Non-HDL Cholesterol (Calc): 166 mg/dL (calc) — ABNORMAL HIGH (ref <130)
Total CHOL/HDL Ratio: 4.5 (calc) (ref <5.0)
Triglycerides: 129 mg/dL (ref <150)

## 2021-03-21 ENCOUNTER — Other Ambulatory Visit: Payer: Self-pay | Admitting: Family Medicine

## 2021-03-21 ENCOUNTER — Telehealth: Payer: Self-pay | Admitting: Family Medicine

## 2021-03-21 DIAGNOSIS — R7989 Other specified abnormal findings of blood chemistry: Secondary | ICD-10-CM

## 2021-03-21 NOTE — Telephone Encounter (Signed)
Pt is calling to ask if Dr. Kirtland Bouchard can result her lab results and give recommendations. Pt has seen her results on lab results. She is worried about the labs. [phone] or call husband Ree Kida- [phone]

## 2021-03-21 NOTE — Telephone Encounter (Signed)
Pts husband calling again regarding lab results, please advise.

## 2021-03-21 NOTE — Telephone Encounter (Signed)
Called patient. Long discussion regarding her abnormal lab results  Last seen 03/15/21 routine visit, labs done on 11/30, I was out of office and the results were available to patient, and she / husband called office for results and on mychart did not receive them.  I am concerned about her elevated creatinine, last year 07/2019 it was normal, this is new finding 1.85 GFR 30s.  She is relatively asymptomatic.  She has elevated lipids.  I will place STAT Renal Panel lab tomorrow, scheduled her for 815am  Also ordered Urinalysis and Urine protein creatinine ratio lab.  She requested referral to Nephrology ASAP.  I advised her lab tomorrow, review creatinine, improve hydration and we will discuss first and place referral. Likely will do urgent referral to local [facility] and they can further assess and if need [facility].  We will come up with a referral plan that she is comfortable with once we get repeat labs and we can contact her tomorrow 03/22/21  Saralyn Pilar, DO [facility] 03/21/2021, 7:36 PM

## 2021-03-21 NOTE — Telephone Encounter (Signed)
Pt's husband calling requesting a call back today regarding wife's lab results.  Pt is worried about the labs. [phone] or call husband Ree Kida- [phone]

## 2021-03-22 ENCOUNTER — Other Ambulatory Visit: Payer: Self-pay | Admitting: Family Medicine

## 2021-03-22 ENCOUNTER — Other Ambulatory Visit: Payer: 59

## 2021-03-22 DIAGNOSIS — R7989 Other specified abnormal findings of blood chemistry: Secondary | ICD-10-CM

## 2021-03-22 DIAGNOSIS — E7800 Pure hypercholesterolemia, unspecified: Secondary | ICD-10-CM

## 2021-03-22 NOTE — Telephone Encounter (Signed)
Please see documentation on chart, phone note, and lab result notes. This issue was addressed directly with patient on the phone and not handled via MyChart platform. Closing this encounter now. All of patients questions have been answered, lab was repeated, and plan was made  Saralyn Pilar, DO [facility] 03/22/2021, 5:41 PM

## 2021-03-22 NOTE — Telephone Encounter (Signed)
I have called Dr Rexene Edison Lateef [facility] [place] to curbside him with advice and he agrees that if lab comes back now normal Cr it would likely be lab error. To check this first. No further treatment or work up needed right now.  Lab results released to MyChart with comments for patient.   Urine is normal, except +1 ketone, this is incidental or not significant at this time.   No protein in urine rules out nephrotic syndrome   Chemistry shows normal renal panel. Creatinine is normal now, this is very reassuring.   As discussed, monitor for symptoms, we will re-check labs and urine in 3 months, orders are already in, go ahead and call to schedule lab and then separate visit with me in 3 months, and we can review further. If at any point you are concerned we can refer to Dr Cherylann Ratel for consult and advice. Let me know if questions  Saralyn Pilar, DO [facility] 03/22/2021, 1:03 PM

## 2021-03-26 LAB — RENAL FUNCTION PANEL
Albumin: 4.5 g/dL (ref 3.6–5.1)
BUN: 10 mg/dL (ref 7–25)
CO2: 25 mmol/L (ref 20–32)
Calcium: 9.2 mg/dL (ref 8.6–10.2)
Chloride: 101 mmol/L (ref 98–110)
Creat: 0.63 mg/dL (ref 0.50–0.97)
Glucose, Bld: 85 mg/dL (ref 65–99)
Phosphorus: 2.8 mg/dL (ref 2.5–4.5)
Potassium: 4.2 mmol/L (ref 3.5–5.3)
Sodium: 136 mmol/L (ref 135–146)

## 2021-03-26 LAB — URINALYSIS, ROUTINE W REFLEX MICROSCOPIC
Bilirubin Urine: NEGATIVE
Glucose, UA: NEGATIVE
Hgb urine dipstick: NEGATIVE
Leukocytes,Ua: NEGATIVE
Nitrite: NEGATIVE
Protein, ur: NEGATIVE
Specific Gravity, Urine: 1.003 (ref 1.001–1.035)
pH: 6 (ref 5.0–8.0)

## 2021-03-26 LAB — PROTEIN / CREATININE RATIO, URINE
Creatinine, Urine: 24 mg/dL (ref 20–275)
Total Protein, Urine: <4 mg/dL — ABNORMAL LOW (ref 5–24)

## 2021-04-02 ENCOUNTER — Ambulatory Visit: Payer: Self-pay

## 2021-04-02 NOTE — Telephone Encounter (Signed)
Second attempt to call pt. LM to call back to discuss. Advised UC if still wanting tamiflu.

## 2021-04-02 NOTE — Telephone Encounter (Signed)
The patient is currently experiencing a headache as well body aches   The patient has been in close contact with their husband child who have both tested positive for the flu   The patient would like to be prescribed tamiflu is possible   Called pt and LM on VM to call back to discuss sx.

## 2021-04-02 NOTE — Telephone Encounter (Addendum)
Third attempt made to reach pt. Advised to call back if has any questions or needs to discuss sx. Call back number provided.  Routing to [facility].

## 2021-05-11 IMAGING — US US OB COMP +14 WK
2 series · 13 of 28 positions shown · non-contrast
Comparison: none

CLINICAL DATA: Assess growth.  Borderline low AFI.

EXAM:
OBSTETRIC 14+ WK ULTRASOUND FOLLOW-UP

[Series 1: us ob comp + 14 wk · 12 of 37 slices shown]
[im 2/37]
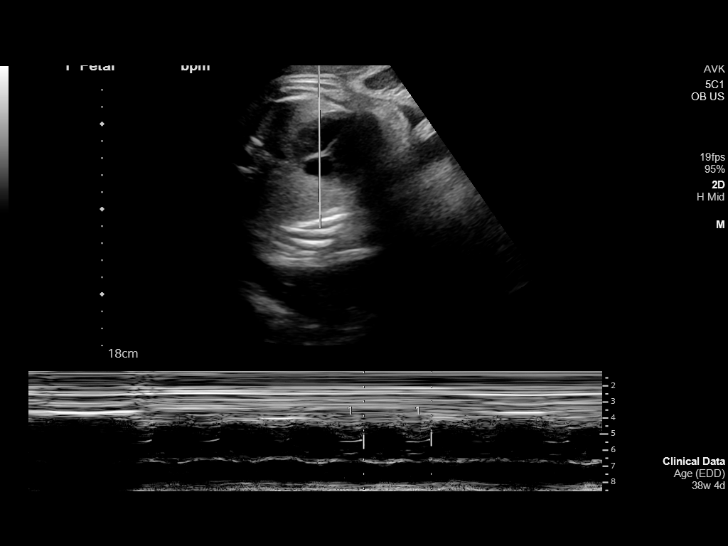
[im 5/37]
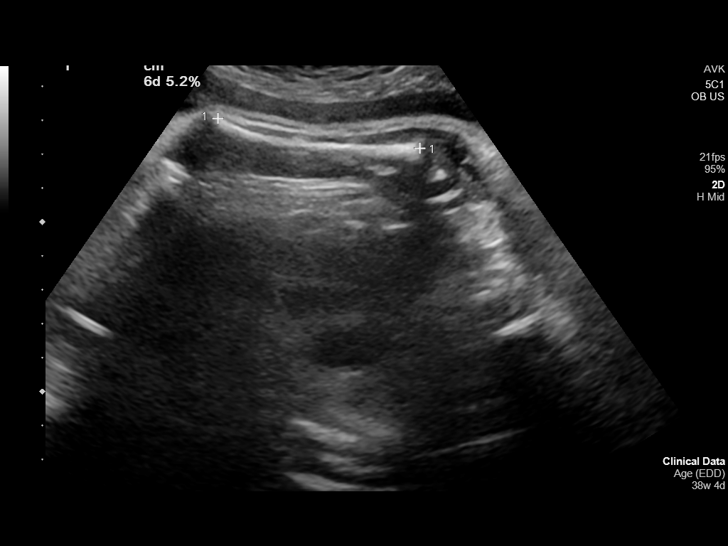
[im 8/37]
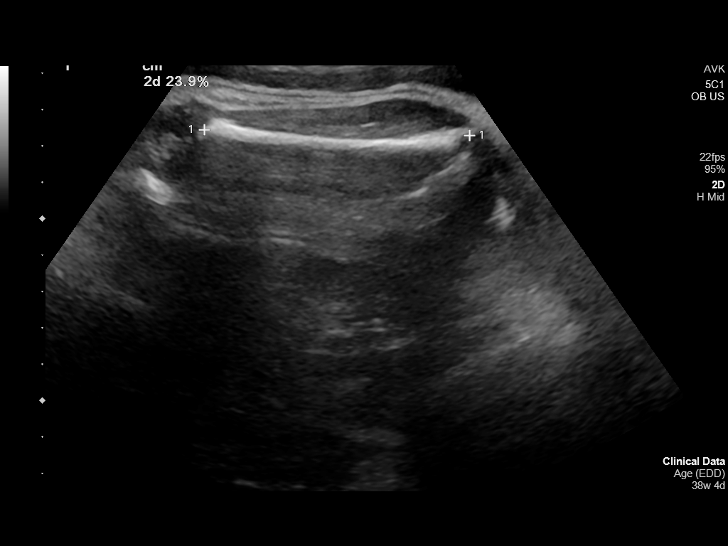
[im 11/37]
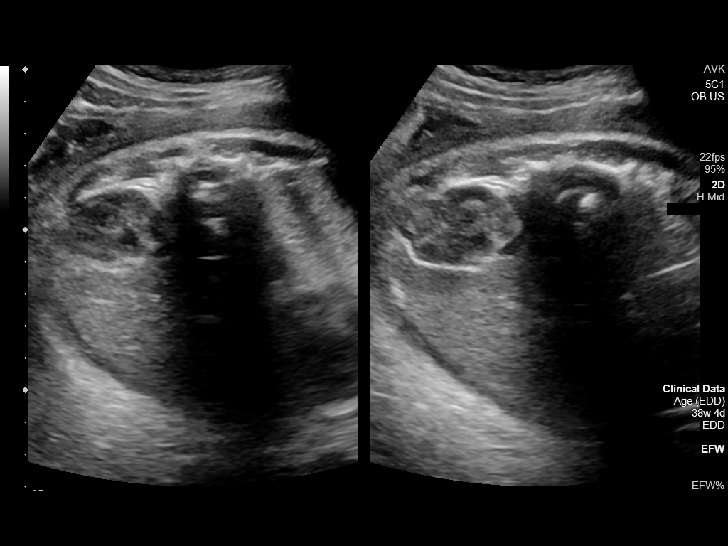
[im 14/37]
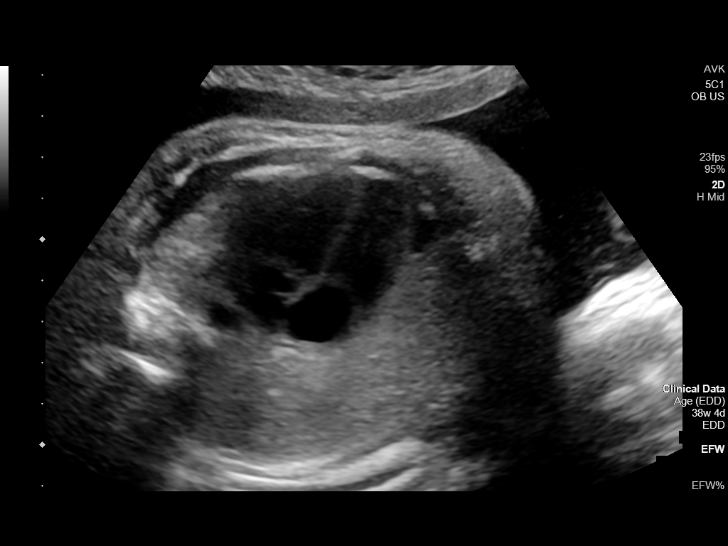
[im 17/37]
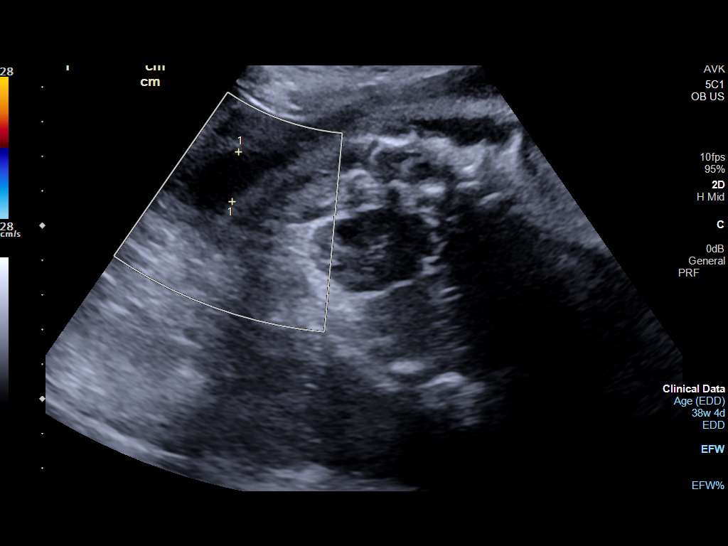
[im 22/37]
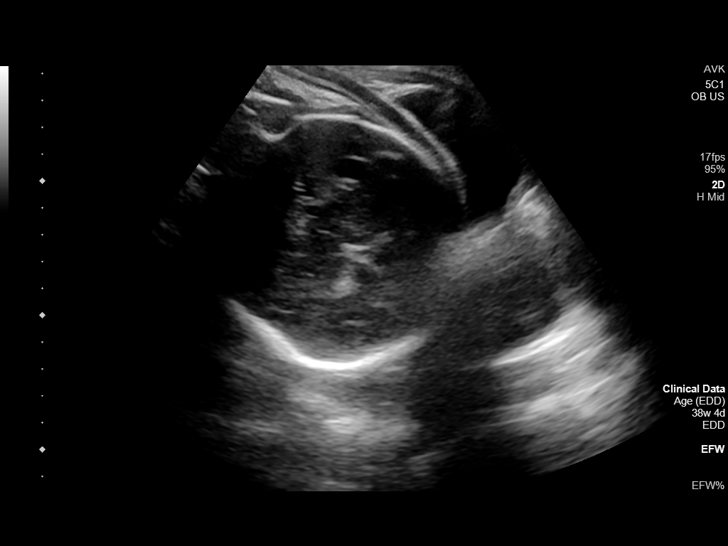
[im 25/37]
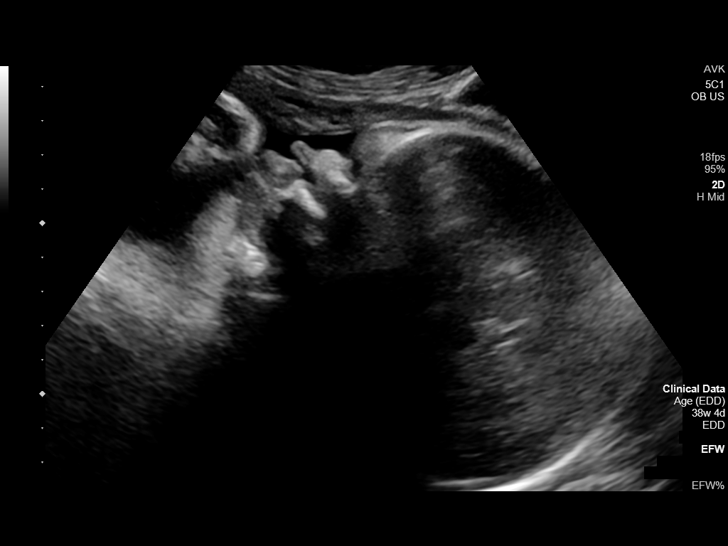
[im 28/37]
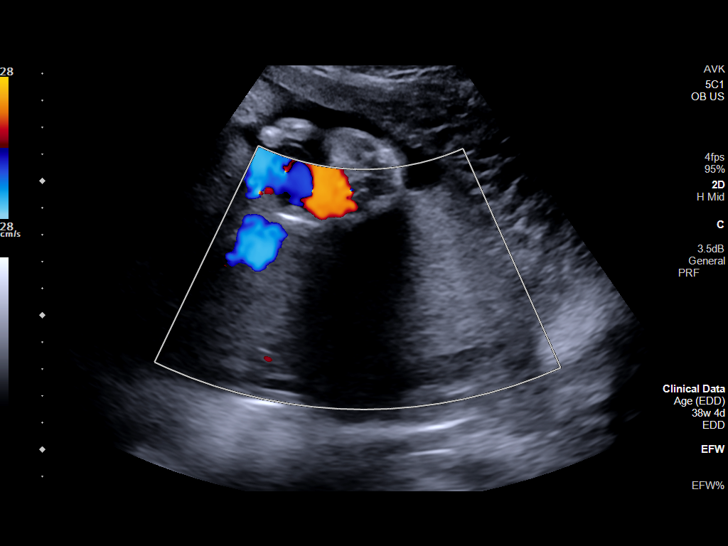
[im 31/37]
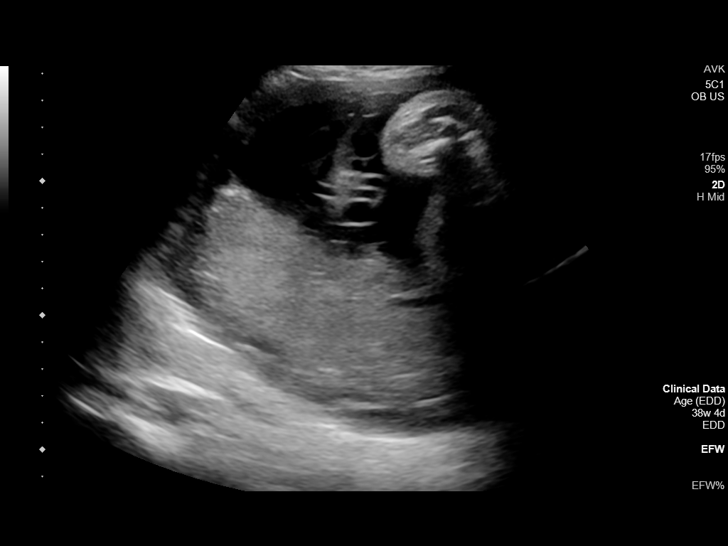
[im 34/37]
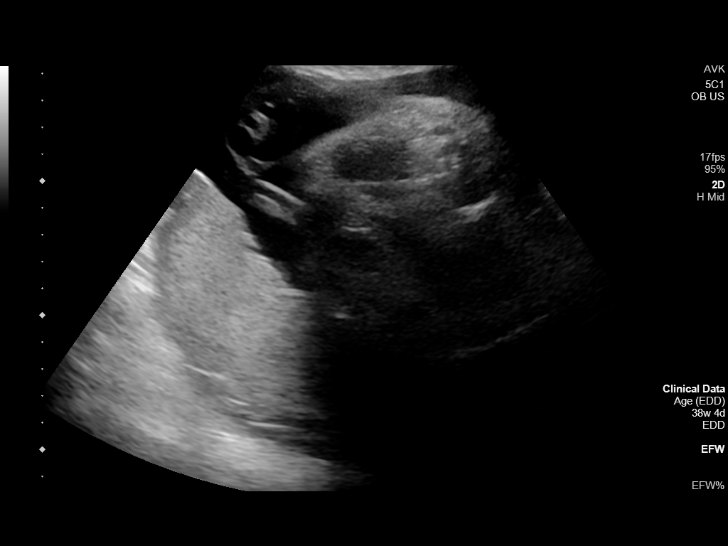
[im 37/37]
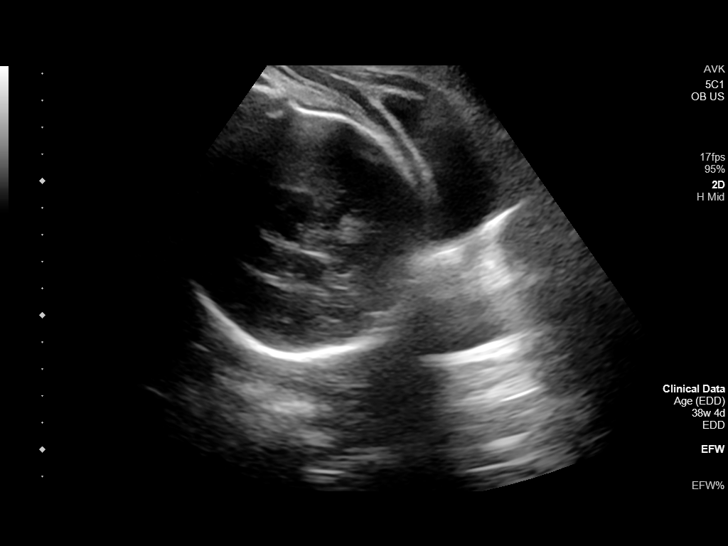

[Series 1001: ob us · 1 of 5 slices shown]
[im 3/5]
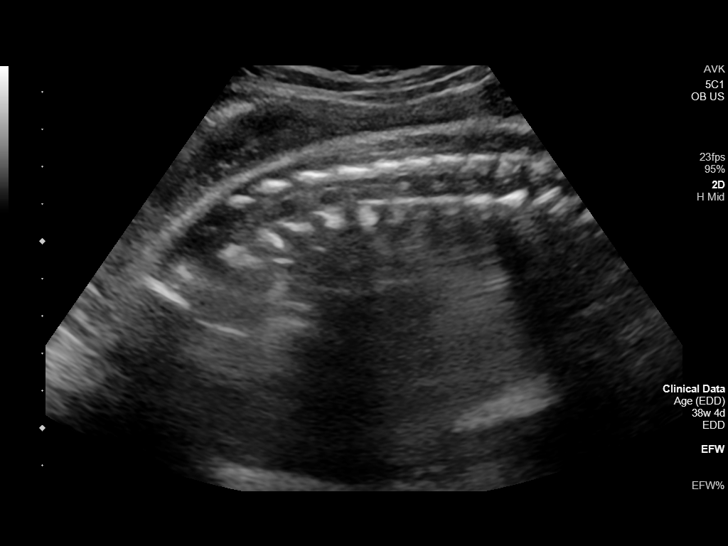

[13 of 28 positions shown; findings below may reference images not displayed]

FINDINGS: Number of Fetuses: 1

Heart Rate:  152 bpm

Movement: Present

Presentation: Cephalic

Previa: None

Placental Location: Posterior

Amniotic Fluid (Subjective): Normal

Amniotic Fluid (Objective):

AFI 8.3 cm (5%ile= 7.2 cm, 95%= 22.6 cm for 39 wks)

FETAL BIOMETRY

BPD:  8.95cm 36w 2d

HC:    32.85cm 37w 2d

AC:    35.99cm 39w 6d

FL:    7.32cm 37w 3d

Current Mean GA: 37w 3d US EDC: 09/22/2019

Assigned GA: 38w 4d Assigned EDC: 09/14/2019

Estimated Fetal Weight:  3540g 67%ile

FETAL ANATOMY

Lateral Ventricles: Appears normal

Thalami/CSP: Appears normal

Posterior Fossa: Not visualized

Nuchal Region: Not visualized

Upper Lip: Appears normal

Spine: Limited views appear normal.

4 Chamber Heart on Left: Appears normal

LVOT: Not visualized

RVOT: Not visualized

Stomach on Left: Appears normal

3 Vessel Cord: Appears normal

Cord Insertion site: Not visualized

Kidneys: Appears normal

Bladder: Appears normal

Extremities: Not visualized

Sex: Not Visualized

Technical Limitations: Exam is limited by advanced gestational age.
Previous anatomic survey was performed at an outside facility.

Maternal Findings:

Cervix:  Not assessed
IMPRESSION: 1. Single living intrauterine fetus in cephalic presentation.
2. Size and dates correlate within 8 days.
3. Amniotic fluid volume in the LOWER ranges of normal.

## 2021-06-13 ENCOUNTER — Other Ambulatory Visit: Payer: Self-pay

## 2021-06-13 DIAGNOSIS — E7800 Pure hypercholesterolemia, unspecified: Secondary | ICD-10-CM

## 2021-06-13 DIAGNOSIS — R7989 Other specified abnormal findings of blood chemistry: Secondary | ICD-10-CM

## 2021-06-14 ENCOUNTER — Other Ambulatory Visit: Payer: 59

## 2021-06-16 ENCOUNTER — Other Ambulatory Visit: Payer: 59

## 2021-06-16 ENCOUNTER — Other Ambulatory Visit: Payer: Self-pay

## 2021-06-17 LAB — URINALYSIS, ROUTINE W REFLEX MICROSCOPIC
Bacteria, UA: NONE SEEN /HPF
Bilirubin Urine: NEGATIVE
Glucose, UA: NEGATIVE
Hgb urine dipstick: NEGATIVE
Hyaline Cast: NONE SEEN /LPF
Ketones, ur: NEGATIVE
Leukocytes,Ua: NEGATIVE
Nitrite: NEGATIVE
RBC / HPF: NONE SEEN /HPF (ref 0–2)
Specific Gravity, Urine: 1.028 (ref 1.001–1.035)
pH: 6 (ref 5.0–8.0)

## 2021-06-17 LAB — CBC WITH DIFFERENTIAL/PLATELET
Absolute Monocytes: 400 cells/uL (ref 200–950)
Basophils Absolute: 51 cells/uL (ref 0–200)
Basophils Relative: 1.1 %
Eosinophils Absolute: 271 cells/uL (ref 15–500)
Eosinophils Relative: 5.9 %
HCT: 43.4 % (ref 35.0–45.0)
Hemoglobin: 14.9 g/dL (ref 11.7–15.5)
Lymphs Abs: 1523 cells/uL (ref 850–3900)
MCH: 33 pg (ref 27.0–33.0)
MCHC: 34.3 g/dL (ref 32.0–36.0)
MCV: 96.2 fL (ref 80.0–100.0)
MPV: 10.2 fL (ref 7.5–12.5)
Monocytes Relative: 8.7 %
Neutro Abs: 2355 cells/uL (ref 1500–7800)
Neutrophils Relative %: 51.2 %
Platelets: 285 10*3/uL (ref 140–400)
RBC: 4.51 10*6/uL (ref 3.80–5.10)
RDW: 11.4 % (ref 11.0–15.0)
Total Lymphocyte: 33.1 %
WBC: 4.6 10*3/uL (ref 3.8–10.8)

## 2021-06-17 LAB — IRON,TIBC AND FERRITIN PANEL
%SAT: 46 % (calc) — ABNORMAL HIGH (ref 16–45)
Ferritin: 34 ng/mL (ref 16–154)
Iron: 99 ug/dL (ref 40–190)
TIBC: 213 mcg/dL (calc) — ABNORMAL LOW (ref 250–450)

## 2021-06-17 LAB — BASIC METABOLIC PANEL WITH GFR
BUN: 14 mg/dL (ref 7–25)
CO2: 28 mmol/L (ref 20–32)
Calcium: 9.3 mg/dL (ref 8.6–10.2)
Chloride: 104 mmol/L (ref 98–110)
Creat: 0.76 mg/dL (ref 0.50–0.97)
Glucose, Bld: 92 mg/dL (ref 65–99)
Potassium: 4.4 mmol/L (ref 3.5–5.3)
Sodium: 140 mmol/L (ref 135–146)
eGFR: 105 mL/min/{1.73_m2} (ref >=60)

## 2021-06-17 LAB — LIPID PANEL
Cholesterol: 192 mg/dL (ref <200)
HDL: 42 mg/dL — ABNORMAL LOW (ref >=50)
LDL Cholesterol (Calc): 129 mg/dL (calc) — ABNORMAL HIGH
Non-HDL Cholesterol (Calc): 150 mg/dL (calc) — ABNORMAL HIGH (ref <130)
Total CHOL/HDL Ratio: 4.6 (calc) (ref <5.0)
Triglycerides: 104 mg/dL (ref <150)

## 2021-06-21 ENCOUNTER — Ambulatory Visit (INDEPENDENT_AMBULATORY_CARE_PROVIDER_SITE_OTHER): Payer: 59 | Admitting: Family Medicine

## 2021-06-21 ENCOUNTER — Other Ambulatory Visit: Payer: Self-pay

## 2021-06-21 ENCOUNTER — Encounter: Payer: Self-pay | Admitting: Family Medicine

## 2021-06-21 VITALS — BP 97/52 | HR 62 | Ht 65.0 in | Wt 165.6 lb

## 2021-06-21 DIAGNOSIS — Z6827 Body mass index (BMI) 27.0-27.9, adult: Secondary | ICD-10-CM

## 2021-06-21 DIAGNOSIS — N069 Isolated proteinuria with unspecified morphologic lesion: Secondary | ICD-10-CM | POA: Diagnosis not present

## 2021-06-21 DIAGNOSIS — Z0000 Encounter for general adult medical examination without abnormal findings: Secondary | ICD-10-CM | POA: Diagnosis not present

## 2021-06-21 DIAGNOSIS — E7800 Pure hypercholesterolemia, unspecified: Secondary | ICD-10-CM | POA: Diagnosis not present

## 2021-06-21 DIAGNOSIS — Z9889 Other specified postprocedural states: Secondary | ICD-10-CM

## 2021-06-21 DIAGNOSIS — Z86018 Personal history of other benign neoplasm: Secondary | ICD-10-CM

## 2021-06-21 NOTE — Patient Instructions (Addendum)
Thank you for coming to the office today. ? ?Mild elevated LDL cholesterol, goal to limit some extra cholesterol sources in the diet. Focus on lower cholesterol dairy, and limit fried, and some animal proteins beef/pork etc. No rx cholesterol medicine, we can revisit this yearly, likely no further treatment until later in life. ? ?Check on specifics for mother's fam history of colon polyps pre cancerous, let me know specifics and then the question would be to the GI office for their billing to see if it is covered. ? ?For Mammogram screening for breast cancer  ? ?Call the Imaging Center below anytime to schedule your own appointment now that order has been placed. ? ?[facility] ?[facility] ?[address] ?[place], [place] [address] ?Phone: [phone] ? ?---------------------------------------- ? ?Check in 1-2 week before lab - to make sure we have all correct orders including urine test. ? ? ?DUE for FASTING BLOOD WORK (no food or drink after midnight before the lab appointment, only water or coffee without cream/sugar on the morning of) ? ?SCHEDULE "Lab Only" visit in the morning at the clinic for lab draw in 1 YEAR ? ?- Make sure Lab Only appointment is at about 1 week before your next appointment, so that results will be available ? ?For Lab Results, once available within 2-3 days of blood draw, you can can log in to MyChart online to view your results and a brief explanation. Also, we can discuss results at next follow-up visit. ? ? ? ?Please schedule a Follow-up Appointment to: Return in about 1 year (around 06/22/2022) for 1 year fasting lab only then 1 week later Annual Physical. ? ?If you have any other questions or concerns, please feel free to call the office or send a message through MyChart. You may also schedule an earlier appointment if necessary. ? ?Additionally, you may be receiving a survey about your experience at our office within a few days to 1 week by  e-mail or mail. We value your feedback. ? ?Saralyn Pilar, DO ?[facility], [place] ?

## 2021-06-21 NOTE — Progress Notes (Signed)
Subjective:    Patient ID: Tammy Barton, female    DOB: 27-May-1985, 36 y.o.   MRN: [id number]  Tammy Barton is a 36 y.o. female presenting on 06/21/2021 for Annual Exam   HPI  Here for Annual Physical   Hemochromatosis Hereditary condition Previous followed by Hematology at [facility]. She was discharged by them and has done well. She has done some phlebotomy at one point in time. Recent anemia panel done 2023 showed improved results. Tsat% 46, nearly normal range, better than 3 months ago at 65%, and Iron and Ferritin in a good range as well.  Cholesterol screening LDL mild elevated 120s, otherwise panel in normal range slightly low HDL. Improved from previous. Goal to work on diet.   Elevated Blood Sugar, Impaired Fasting Due for lab test She has history of some elevated sugars.   Admits life stressors lately but not diagnosed with anxiety. Says it is not impacting her function.   Lifestyle She is adjusting to post-pregnancy period after 2nd child now almost at 3 yr. Diet: balanced diet Exercise:  goal to improve back on regimen, joined gym  Trace Proteinuria    Health Maintenance: Abnormal fibroadenoma breast biopsy 2015, determined benign. No repeat. fam history breast cancer grandparent age 23+  Fam history - Mother has had recurring precancerous polyps. Age 38s, she has had several polyps 5+ in past, then repeat in 6 months, then repeat colonoscopy.  UTD Flu Shot already   Depression screen [facility] 2/9 06/21/2021 03/15/2021 03/15/2021  Decreased Interest 0 0 0  Down, Depressed, Hopeless 0 1 1  PHQ - 2 Score 0 1 1  Altered sleeping 0 0 0  Tired, decreased energy _0 Change in appetite 0 0 0  Feeling bad or failure about yourself  0 0 0  Trouble concentrating 0 0 0  Moving slowly or fidgety/restless 0 0 0  Suicidal thoughts 0 0 0  PHQ-9 Score _1 Difficult doing work/chores Not difficult at all Not difficult at all Not difficult at all    Past  Medical History:  Diagnosis Date   Iron overload    Past Surgical History:  Procedure Laterality Date   BREAST SURGERY     left breast mass, biopsy, benign      Social History   Socioeconomic History   Marital status: Married    Spouse name: Not on file   Number of children: Not on file   Years of education: Not on file   Highest education level: Not on file  Occupational History   Not on file  Tobacco Use   Smoking status: Never   Smokeless tobacco: Never  Vaping Use   Vaping Use: Never used  Substance and Sexual Activity   Alcohol use: Yes    Alcohol/week: 1.0 - 2.0 standard drink    Types: 1 - 2 Standard drinks or equivalent per week   Drug use: No   Sexual activity: Yes    Birth control/protection: None  Other Topics Concern   Not on file  Social History Narrative   Not on file   Social Determinants of Health   Financial Resource Strain: Not on file  Food Insecurity: Not on file  Transportation Needs: Not on file  Physical Activity: Not on file  Stress: Not on file  Social Connections: Not on file  Intimate Partner Violence: Not on file   Family History  Problem Relation Age of Onset   Diabetes Paternal Grandmother  No current outpatient medications on file prior to visit.   No current facility-administered medications on file prior to visit.    Review of Systems  Constitutional:  Negative for activity change, appetite change, chills, diaphoresis, fatigue and fever.  HENT:  Negative for congestion and hearing loss.   Eyes:  Negative for visual disturbance.  Respiratory:  Negative for cough, chest tightness, shortness of breath and wheezing.   Cardiovascular:  Negative for chest pain, palpitations and leg swelling.  Gastrointestinal:  Negative for abdominal pain, constipation, diarrhea, nausea and vomiting.  Genitourinary:  Negative for dysuria, frequency and hematuria.  Musculoskeletal:  Negative for arthralgias and neck pain.  Skin:  Negative for  rash.  Neurological:  Negative for dizziness, weakness, light-headedness, numbness and headaches.  Hematological:  Negative for adenopathy.  Psychiatric/Behavioral:  Negative for behavioral problems, dysphoric mood and sleep disturbance.   Per HPI unless specifically indicated above      Objective:    BP (!) 97/52    Pulse 62    Ht _0  (1.651 m)    Wt 165 lb 9.6 oz (75.1 kg)    SpO2 100%    BMI 27.56 kg/m   Wt Readings from Last 3 Encounters:  06/21/21 165 lb 9.6 oz (75.1 kg)  03/15/21 165 lb 6.4 oz (75 kg)  09/16/19 154 lb 5.2 oz (70 kg)    Physical Exam Vitals and nursing note reviewed.  Constitutional:      General: She is not in acute distress.    Appearance: She is well-developed. She is not diaphoretic.     Comments: Well-appearing, comfortable, cooperative  HENT:     Head: Normocephalic and atraumatic.  Eyes:     General:        Right eye: No discharge.        Left eye: No discharge.     Conjunctiva/sclera: Conjunctivae normal.     Pupils: Pupils are equal, round, and reactive to light.  Neck:     Thyroid: No thyromegaly.     Vascular: No carotid bruit.  Cardiovascular:     Rate and Rhythm: Normal rate and regular rhythm.     Pulses: Normal pulses.     Heart sounds: Normal heart sounds. No murmur heard. Pulmonary:     Effort: Pulmonary effort is normal. No respiratory distress.     Breath sounds: Normal breath sounds. No wheezing or rales.  Abdominal:     General: Bowel sounds are normal. There is no distension.     Palpations: Abdomen is soft. There is no mass.     Tenderness: There is no abdominal tenderness.  Musculoskeletal:        General: No tenderness. Normal range of motion.     Cervical back: Normal range of motion and neck supple.     Comments: Upper / Lower Extremities: - Normal muscle tone, strength bilateral upper extremities 5/5, lower extremities 5/5  Lymphadenopathy:     Cervical: No cervical adenopathy.  Skin:    General: Skin is warm and  dry.     Findings: No erythema or rash.  Neurological:     Mental Status: She is alert and oriented to person, place, and time.     Comments: Distal sensation intact to light touch all extremities  Psychiatric:        Mood and Affect: Mood normal.        Behavior: Behavior normal.        Thought Content: Thought content normal.  Comments: Well groomed, good eye contact, normal speech and thoughts     Results for orders placed or performed in visit on 06/13/21  Iron, TIBC and Ferritin Panel  Result Value Ref Range   Iron 99 40 - 190 mcg/dL   TIBC 213 (L) 250 - 450 mcg/dL (calc)   %SAT 46 (H) 16 - 45 % (calc)   Ferritin 34 16 - 154 ng/mL  CBC with Differential/Platelet  Result Value Ref Range   WBC 4.6 3.8 - 10.8 Thousand/uL   RBC 4.51 3.80 - 5.10 Million/uL   Hemoglobin 14.9 11.7 - 15.5 g/dL   HCT 43.4 35.0 - 45.0 %   MCV 96.2 80.0 - 100.0 fL   MCH 33.0 27.0 - 33.0 pg   MCHC 34.3 32.0 - 36.0 g/dL   RDW 11.4 11.0 - 15.0 %   Platelets 285 140 - 400 Thousand/uL   MPV 10.2 7.5 - 12.5 fL   Neutro Abs 2,355 1,500 - 7,800 cells/uL   Lymphs Abs 1,523 850 - 3,900 cells/uL   Absolute Monocytes 400 200 - 950 cells/uL   Eosinophils Absolute 271 15 - 500 cells/uL   Basophils Absolute 51 0 - 200 cells/uL   Neutrophils Relative % 51.2 %   Total Lymphocyte 33.1 %   Monocytes Relative 8.7 %   Eosinophils Relative 5.9 %   Basophils Relative 1.1 %  Lipid panel  Result Value Ref Range   Cholesterol 192 <200 mg/dL   HDL 42 (L) > OR = 50 mg/dL   Triglycerides 104 <150 mg/dL   LDL Cholesterol (Calc) 129 (H) mg/dL (calc)   Total CHOL/HDL Ratio 4.6 <5.0 (calc)   Non-HDL Cholesterol (Calc) 150 (H) <130 mg/dL (calc)  Urinalysis, Routine w reflex microscopic  Result Value Ref Range   Color, Urine YELLOW YELLOW   APPearance CLEAR CLEAR   Specific Gravity, Urine 1.028 1.001 - 1.035   pH 6.0 5.0 - 8.0   Glucose, UA NEGATIVE NEGATIVE   Bilirubin Urine NEGATIVE NEGATIVE   Ketones, ur  NEGATIVE NEGATIVE   Hgb urine dipstick NEGATIVE NEGATIVE   Protein, ur TRACE (A) NEGATIVE   Nitrite NEGATIVE NEGATIVE   Leukocytes,Ua NEGATIVE NEGATIVE   WBC, UA 0-5 0 - 5 /HPF   RBC / HPF NONE SEEN 0 - 2 /HPF   Squamous Epithelial / LPF 6-10 (A) < OR = 5 /HPF   Bacteria, UA NONE SEEN NONE SEEN /HPF   Hyaline Cast NONE SEEN NONE SEEN /LPF  BASIC METABOLIC PANEL WITH GFR  Result Value Ref Range   Glucose, Bld 92 65 - 99 mg/dL   BUN 14 7 - 25 mg/dL   Creat 0.76 0.50 - 0.97 mg/dL   eGFR 105 > OR = 60 mL/min/1.73m   BUN/Creatinine Ratio NOT APPLICABLE 6 - 22 (calc)   Sodium 140 135 - 146 mmol/L   Potassium 4.4 3.5 - 5.3 mmol/L   Chloride 104 98 - 110 mmol/L   CO2 28 20 - 32 mmol/L   Calcium 9.3 8.6 - 10.2 mg/dL      Assessment & Plan:   Problem List Items Addressed This Visit     Hereditary hemochromatosis ([place]hedd   Other Visit Diagnoses     Annual physical exam    -  Primary   Pure hypercholesterolemia       Isolated proteinuria with morphologic lesion       Relevant Orders   Protein / creatinine ratio, urine   BMI 27.0-27.9,adult  S/P excision of fibroadenoma of breast       Relevant Orders   MM 3D SCREEN BREAST BILATERAL       Updated Health Maintenance information Future consider earliest age for colonoscopy given fam history multiple polyps, she can follow up with GI billing if covered. Screening Mammogram ordered, w her history of prior fibroadenoma. Reviewed recent lab results with patient Encouraged improvement to lifestyle with diet and exercise See AVS for advice on mild elevated LDL Goal of weight loss  Creatinine resolved, remains at baseline - prior significant elevated result seems to be likely false or inaccurate Urinalysis shows trace protein - will follow up with [facility]  Hereditary hemochromatosis Iron panel is reassuring, and improved Continue to monitor   Orders Placed This Encounter  Procedures   MM 3D SCREEN BREAST BILATERAL     Standing Status:   Future    Standing Expiration Date:   06/22/2022    Order Specific Question:   Reason for Exam (SYMPTOM  OR DIAGNOSIS REQUIRED)    Answer:   Screening bilateral 3D Mammogram Tomo    Order Specific Question:   Preferred imaging location?    Answer:   [facility]   Protein / creatinine ratio, urine    No orders of the defined types were placed in this encounter.     Follow up plan: Return in about 1 year (around 06/22/2022) for 1 year fasting lab only then 1 week later Annual Physical.  Nobie Putnam, DO [facility] 06/21/2021, 9:22 AM

## 2021-06-22 LAB — PROTEIN / CREATININE RATIO, URINE
Creatinine, Urine: 29 mg/dL (ref 20–275)
Total Protein, Urine: <4 mg/dL — ABNORMAL LOW (ref 5–24)

## 2022-01-30 ENCOUNTER — Encounter: Payer: Self-pay | Admitting: Certified Nurse Midwife

## 2022-04-26 ENCOUNTER — Telehealth: Payer: Self-pay

## 2022-04-26 NOTE — Telephone Encounter (Signed)
Copied from [place] [id number]. Topic: General - Inquiry >> Apr 26, 2022 11:10 AM Marcellus Scott wrote: Reason for CRM: Pt stated she was recently advised by a NP that both of her ears are blocked with wax. Pt stated Dr.K had mentioned this to her as well for one of her ears at a previous appointment. Pt would like to know if this is something Dr.K can help her with or if she would need a referral to ENT.   Pt wants to be certain that Dr.K can help her before scheduling.    Please advise.

## 2022-04-26 NOTE — Telephone Encounter (Signed)
Yes we can see her for an office visit for ear wax flushing here.  She was seen by urgent care on 1/7 for sore throat.  Please make sure she is feeling well and not sick when she schedules for ear wax removal  Tammy Putnam, DO [facility] 04/26/2022, 12:36 PM

## 2022-06-29 ENCOUNTER — Ambulatory Visit (INDEPENDENT_AMBULATORY_CARE_PROVIDER_SITE_OTHER): Payer: 59 | Admitting: Family Medicine

## 2022-06-29 ENCOUNTER — Encounter: Payer: Self-pay | Admitting: Family Medicine

## 2022-06-29 VITALS — BP 112/68 | HR 70 | Ht 65.0 in | Wt 164.8 lb

## 2022-06-29 DIAGNOSIS — Z124 Encounter for screening for malignant neoplasm of cervix: Secondary | ICD-10-CM

## 2022-06-29 DIAGNOSIS — Z1159 Encounter for screening for other viral diseases: Secondary | ICD-10-CM

## 2022-06-29 DIAGNOSIS — Z0000 Encounter for general adult medical examination without abnormal findings: Secondary | ICD-10-CM

## 2022-06-29 DIAGNOSIS — E7800 Pure hypercholesterolemia, unspecified: Secondary | ICD-10-CM | POA: Diagnosis not present

## 2022-06-29 DIAGNOSIS — R7309 Other abnormal glucose: Secondary | ICD-10-CM | POA: Diagnosis not present

## 2022-06-29 DIAGNOSIS — N069 Isolated proteinuria with unspecified morphologic lesion: Secondary | ICD-10-CM

## 2022-06-29 NOTE — Progress Notes (Addendum)
Subjective:    Patient ID: Tammy Barton, female    DOB: 1985/06/19, 37 y.o.   MRN: [id number]  Tammy Barton is a 37 y.o. female presenting on 06/29/2022 for Annual Exam   HPI  Here for Annual Physical and due for fasting lab   Admits some stressors with daughter's genetic health issues but now goal to focus on her own health as well. Goal to improve on healthy diet Goal for more low impact exercise and goal for some cardio in future Following online PT issue Goal to train to running in future   Hemochromatosis Hereditary condition Previous followed by Hematology at [facility]. She was discharged by them and has done well. She has done some phlebotomy at one point in time. Prior anemia panel done 2023 showed improved results. Tsat% 46, nearly normal range, better than 3 months ago at 65%, and Iron and Ferritin in a good range as well. Due for labs.   Hyperlipidemia Last lab 06/2021 with LDL 129, improved from 140s Goal to work on diet. Due for lab repeat   Elevated Blood Sugar, Impaired Fasting Due for lab test She has history of some elevated sugars.   Admits life stressors lately but not diagnosed with anxiety. Says it is not impacting her function. Some daily anxiety Now following up with therapist   Trace Proteinuria - due for urine repeat today  Ear Wax     Health Maintenance: Abnormal fibroadenoma breast biopsy 2015, determined benign. No repeat. fam history breast cancer grandparent age 59+   Fam history - Mother has had recurring precancerous polyps. Age 61s, she has had several polyps 5+ in past, then repeat in 6 months, then repeat colonoscopy.  UTD Flu Shot already   Health Maintenance:  09/17/19 HIV negative Check Hep C screening       06/29/2022   10:20 AM 06/21/2021    9:15 AM 03/15/2021   10:34 AM  Depression screen PHQ 2/9  Decreased Interest 0 0 0  Down, Depressed, Hopeless 0 0 1  PHQ - 2 Score 0 0 1  Altered sleeping  0 0   Tired, decreased energy  1 2  Change in appetite  0 0  Feeling bad or failure about yourself   0 0  Trouble concentrating  0 0  Moving slowly or fidgety/restless  0 0  Suicidal thoughts  0 0  PHQ-9 Score  1 3  Difficult doing work/chores  Not difficult at all Not difficult at all      06/21/2021    9:15 AM 03/15/2021   10:35 AM 03/15/2021   10:23 AM  GAD 7 : Generalized Anxiety Score  Nervous, Anxious, on Edge '2 1 1  '$ Control/stop worrying '1 1 1  '$ Worry too much - different things '2 1 1  '$ Trouble relaxing '1 1 1  '$ Restless 0 0 0  Easily annoyed or irritable '2 1 1  '$ Afraid - awful might happen '1 1 1  '$ Total GAD 7 Score '9 6 6  '$ Anxiety Difficulty Not difficult at all Not difficult at all Not difficult at all     Past Medical History:  Diagnosis Date   Iron overload    Past Surgical History:  Procedure Laterality Date   BREAST SURGERY     left breast mass, biopsy, benign      Social History   Socioeconomic History   Marital status: Married    Spouse name: Not on file   Number of children: Not on  file   Years of education: Not on file   Highest education level: Not on file  Occupational History   Not on file  Tobacco Use   Smoking status: Never   Smokeless tobacco: Never  Vaping Use   Vaping Use: Never used  Substance and Sexual Activity   Alcohol use: Yes    Alcohol/week: 1.0 - 2.0 standard drink of alcohol    Types: 1 - 2 Standard drinks or equivalent per week   Drug use: No   Sexual activity: Yes    Birth control/protection: None  Other Topics Concern   Not on file  Social History Narrative   Not on file   Social Determinants of Health   Financial Resource Strain: Not on file  Food Insecurity: Not on file  Transportation Needs: Not on file  Physical Activity: Not on file  Stress: Not on file  Social Connections: Not on file  Intimate Partner Violence: Not on file   Family History  Problem Relation Age of Onset   Diabetes Paternal Grandmother     Current Outpatient Medications on File Prior to Visit  Medication Sig   Multiple Vitamin (MULTIVITAMIN) LIQD Take 5 mLs by mouth daily.   No current facility-administered medications on file prior to visit.    Review of Systems  Constitutional:  Negative for activity change, appetite change, chills, diaphoresis, fatigue and fever.  HENT:  Negative for congestion and hearing loss.   Eyes:  Negative for visual disturbance.  Respiratory:  Negative for cough, chest tightness, shortness of breath and wheezing.   Cardiovascular:  Negative for chest pain, palpitations and leg swelling.  Gastrointestinal:  Negative for abdominal pain, constipation, diarrhea, nausea and vomiting.  Genitourinary:  Negative for dysuria, frequency and hematuria.  Musculoskeletal:  Negative for arthralgias and neck pain.  Skin:  Negative for rash.  Neurological:  Negative for dizziness, weakness, light-headedness, numbness and headaches.  Hematological:  Negative for adenopathy.  Psychiatric/Behavioral:  Negative for behavioral problems, dysphoric mood and sleep disturbance.    Per HPI unless specifically indicated above      Objective:    BP 112/68   Pulse 70   Ht '5\' 5"'$  (1.651 m)   Wt 164 lb 12.8 oz (74.8 kg)   SpO2 99%   BMI 27.42 kg/m   Wt Readings from Last 3 Encounters:  06/29/22 164 lb 12.8 oz (74.8 kg)  06/21/21 165 lb 9.6 oz (75.1 kg)  03/15/21 165 lb 6.4 oz (75 kg)    Physical Exam Vitals and nursing note reviewed.  Constitutional:      General: She is not in acute distress.    Appearance: She is well-developed. She is not diaphoretic.     Comments: Well-appearing, comfortable, cooperative  HENT:     Head: Normocephalic and atraumatic.     Right Ear: Tympanic membrane, ear canal and external ear normal.     Left Ear: Tympanic membrane, ear canal and external ear normal.     Ears:     Comments: R ear with small soft wax near opening, removed with Qtip during visit easily without  difficulty.  L ear has larger soft ear wax mid canal, not obstructing, not impacted on TM Eyes:     General:        Right eye: No discharge.        Left eye: No discharge.     Conjunctiva/sclera: Conjunctivae normal.     Pupils: Pupils are equal, round, and reactive to light.  Neck:  Thyroid: No thyromegaly.  Cardiovascular:     Rate and Rhythm: Normal rate and regular rhythm.     Pulses: Normal pulses.     Heart sounds: Normal heart sounds. No murmur heard. Pulmonary:     Effort: Pulmonary effort is normal. No respiratory distress.     Breath sounds: Normal breath sounds. No wheezing or rales.  Abdominal:     General: Bowel sounds are normal. There is no distension.     Palpations: Abdomen is soft. There is no mass.     Tenderness: There is no abdominal tenderness.  Musculoskeletal:        General: No tenderness. Normal range of motion.     Cervical back: Normal range of motion and neck supple.     Comments: Upper / Lower Extremities: - Normal muscle tone, strength bilateral upper extremities 5/5, lower extremities 5/5  Lymphadenopathy:     Cervical: No cervical adenopathy.  Skin:    General: Skin is warm and dry.     Findings: No erythema or rash.  Neurological:     Mental Status: She is alert and oriented to person, place, and time.     Comments: Distal sensation intact to light touch all extremities  Psychiatric:        Mood and Affect: Mood normal.        Behavior: Behavior normal.        Thought Content: Thought content normal.     Comments: Well groomed, good eye contact, normal speech and thoughts      Results for orders placed or performed in visit on 06/29/22  HM HIV SCREENING LAB  Result Value Ref Range   HM HIV Screening Negative - Validated       Assessment & Plan:   Problem List Items Addressed This Visit     Hereditary hemochromatosis ([place])   Relevant Orders   COMPLETE METABOLIC PANEL WITH GFR   CBC with Differential/Platelet   Iron, TIBC  and Ferritin Panel   Other Visit Diagnoses     Annual physical exam    -  Primary   Relevant Orders   COMPLETE METABOLIC PANEL WITH GFR   CBC with Differential/Platelet   Lipid panel   Hemoglobin A1c   Urinalysis, Routine w reflex microscopic   Pure hypercholesterolemia       Relevant Orders   Lipid panel   TSH   Abnormal glucose       Relevant Orders   Hemoglobin A1c   Isolated proteinuria with morphologic lesion       Relevant Orders   Urinalysis, Routine w reflex microscopic   Need for hepatitis C screening test       Relevant Orders   Hepatitis C antibody   Cervical cancer screening       Relevant Orders   Ambulatory referral to Obstetrics / Gynecology       Updated Health Maintenance information Fasting labs ordered today Follow up on Lipids and A1c screening Encouraged improvement to lifestyle with diet and exercise Goal of weight loss  Anxiety Discussed goal to continue w/ therapy and maintain self management Future reconsider options if indicated with medication  Hereditary Hemochromatosis Last iron panel stable Continue to monitor No longer followed by Hematology  Follow up urinalysis, given prior lab trace protein  Referral to GYN Women's health specialist for pap smear screening every 3-5 years and other evaluation  [facility] at [facility] [address] [place],  [place]  [address] [address]: [phone]  Orders Placed This Encounter  Procedures   COMPLETE METABOLIC PANEL WITH GFR   CBC with Differential/Platelet   Lipid panel    Order Specific Question:   Has the patient fasted?    Answer:   Yes   Hemoglobin A1c   Iron, TIBC and Ferritin Panel   TSH   Urinalysis, Routine w reflex microscopic   Hepatitis C antibody   HM HIV SCREENING LAB    This external order was created through the Results Console.   Ambulatory referral to Obstetrics / Gynecology    Referral Priority:   Routine    Referral Type:   Consultation     Referral Reason:   Specialty Services Required    Requested Specialty:   Obstetrics and Gynecology    Number of Visits Requested:   1     No orders of the defined types were placed in this encounter.     Follow up plan: Return in about 1 year (around 06/29/2023) for 1 year Annual Physical fasting lab AFTER.  Nobie Putnam, [facility] 06/29/2022, 10:06 AM

## 2022-06-29 NOTE — Patient Instructions (Addendum)
Thank you for coming to the office today.  Lab testing today. And urine testing as well. Including anemia iron panel.  Referral to GYN Women's health specialist for pap smear screening every 3-5 years and other evaluation  They will call you to schedule  [facility] at [address],  [place]  [address] [address]: [phone]   Please schedule a Follow-up Appointment to: Return in about 1 year (around 06/29/2023) for 1 year Annual Physical fasting lab AFTER.  If you have any other questions or concerns, please feel free to call the office or send a message through [place]. You may also schedule an earlier appointment if necessary.  Additionally, you may be receiving a survey about your experience at our office within a few days to 1 week by e-mail or mail. We value your feedback.  Nobie Putnam, DO [facility]

## 2022-06-30 ENCOUNTER — Encounter: Payer: Self-pay | Admitting: Family Medicine

## 2022-06-30 LAB — COMPLETE METABOLIC PANEL WITH GFR
AG Ratio: 1.5 (calc) (ref 1.0–2.5)
ALT: 23 U/L (ref 6–29)
AST: 25 U/L (ref 10–30)
Albumin: 4.4 g/dL (ref 3.6–5.1)
Alkaline phosphatase (APISO): 65 U/L (ref 31–125)
BUN: 12 mg/dL (ref 7–25)
CO2: 27 mmol/L (ref 20–32)
Calcium: 9.3 mg/dL (ref 8.6–10.2)
Chloride: 104 mmol/L (ref 98–110)
Creat: 0.67 mg/dL (ref 0.50–0.97)
Globulin: 2.9 g/dL (calc) (ref 1.9–3.7)
Glucose, Bld: 90 mg/dL (ref 65–99)
Potassium: 4.2 mmol/L (ref 3.5–5.3)
Sodium: 140 mmol/L (ref 135–146)
Total Bilirubin: 0.5 mg/dL (ref 0.2–1.2)
Total Protein: 7.3 g/dL (ref 6.1–8.1)
eGFR: 117 mL/min/{1.73_m2} (ref >=60)

## 2022-06-30 LAB — LIPID PANEL
Cholesterol: 186 mg/dL (ref <200)
HDL: 48 mg/dL — ABNORMAL LOW (ref >=50)
LDL Cholesterol (Calc): 114 mg/dL (calc) — ABNORMAL HIGH
Non-HDL Cholesterol (Calc): 138 mg/dL (calc) — ABNORMAL HIGH (ref <130)
Total CHOL/HDL Ratio: 3.9 (calc) (ref <5.0)
Triglycerides: 125 mg/dL (ref <150)

## 2022-06-30 LAB — URINALYSIS, ROUTINE W REFLEX MICROSCOPIC
Bilirubin Urine: NEGATIVE
Glucose, UA: NEGATIVE
Hgb urine dipstick: NEGATIVE
Ketones, ur: NEGATIVE
Leukocytes,Ua: NEGATIVE
Nitrite: NEGATIVE
Protein, ur: NEGATIVE
Specific Gravity, Urine: 1.024 (ref 1.001–1.035)
pH: 7 (ref 5.0–8.0)

## 2022-06-30 LAB — CBC WITH DIFFERENTIAL/PLATELET
Absolute Monocytes: 387 cells/uL (ref 200–950)
Basophils Absolute: 58 cells/uL (ref 0–200)
Basophils Relative: 1.1 %
Eosinophils Absolute: 180 cells/uL (ref 15–500)
Eosinophils Relative: 3.4 %
HCT: 43.3 % (ref 35.0–45.0)
Hemoglobin: 14.5 g/dL (ref 11.7–15.5)
Lymphs Abs: 2051 cells/uL (ref 850–3900)
MCH: 32.2 pg (ref 27.0–33.0)
MCHC: 33.5 g/dL (ref 32.0–36.0)
MCV: 96.2 fL (ref 80.0–100.0)
MPV: 10.7 fL (ref 7.5–12.5)
Monocytes Relative: 7.3 %
Neutro Abs: 2624 cells/uL (ref 1500–7800)
Neutrophils Relative %: 49.5 %
Platelets: 268 10*3/uL (ref 140–400)
RBC: 4.5 10*6/uL (ref 3.80–5.10)
RDW: 11.3 % (ref 11.0–15.0)
Total Lymphocyte: 38.7 %
WBC: 5.3 10*3/uL (ref 3.8–10.8)

## 2022-06-30 LAB — IRON,TIBC AND FERRITIN PANEL
%SAT: 81 % (calc) — ABNORMAL HIGH (ref 16–45)
Ferritin: 41 ng/mL (ref 16–154)
Iron: 190 ug/dL (ref 40–190)
TIBC: 234 mcg/dL (calc) — ABNORMAL LOW (ref 250–450)

## 2022-06-30 LAB — HEMOGLOBIN A1C
Hgb A1c MFr Bld: 5.1 % of total Hgb (ref <5.7)
Mean Plasma Glucose: 100 mg/dL
eAG (mmol/L): 5.5 mmol/L

## 2022-06-30 LAB — TSH: TSH: 1.66 mIU/L

## 2022-06-30 LAB — HEPATITIS C ANTIBODY: Hepatitis C Ab: NONREACTIVE

## 2022-07-04 NOTE — Addendum Note (Signed)
Addended by: Olin Hauser on: 07/04/2022 08:05 AM   Modules accepted: Orders

## 2022-07-05 ENCOUNTER — Encounter: Payer: Self-pay | Admitting: Family Medicine

## 2022-07-05 DIAGNOSIS — N6322 Unspecified lump in the left breast, upper inner quadrant: Secondary | ICD-10-CM

## 2022-07-05 DIAGNOSIS — Z1239 Encounter for other screening for malignant neoplasm of breast: Secondary | ICD-10-CM

## 2022-07-06 ENCOUNTER — Encounter: Payer: Self-pay | Admitting: Oncology

## 2022-07-07 ENCOUNTER — Encounter: Payer: Self-pay | Admitting: Family Medicine

## 2022-07-24 NOTE — Addendum Note (Signed)
Addended by: Smitty Cords on: 07/24/2022 06:16 PM   Modules accepted: Orders

## 2022-07-27 ENCOUNTER — Inpatient Hospital Stay
Admission: RE | Admit: 2022-07-27 | Discharge: 2022-07-27 | Disposition: A | Discharge status: Home / Self Care | Payer: Self-pay | Source: Ambulatory Visit | Attending: Family Medicine | Admitting: Family Medicine

## 2022-07-27 ENCOUNTER — Other Ambulatory Visit: Payer: Self-pay | Admitting: *Deleted

## 2022-07-27 DIAGNOSIS — Z1231 Encounter for screening mammogram for malignant neoplasm of breast: Secondary | ICD-10-CM

## 2022-08-08 ENCOUNTER — Encounter: Payer: Self-pay | Admitting: Family Medicine

## 2022-08-08 ENCOUNTER — Ambulatory Visit
Admission: RE | Admit: 2022-08-08 | Discharge: 2022-08-08 | Disposition: A | Discharge status: Home / Self Care | Payer: Managed Care, Other (non HMO) | Source: Ambulatory Visit | Attending: Family Medicine | Admitting: Family Medicine

## 2022-08-08 ENCOUNTER — Ambulatory Visit
Admission: RE | Admit: 2022-08-08 | Discharge: 2022-08-08 | Disposition: A | Discharge status: Home / Self Care | Payer: Managed Care, Other (non HMO) | Source: Ambulatory Visit

## 2022-08-08 ENCOUNTER — Other Ambulatory Visit: Payer: Self-pay | Admitting: Family Medicine

## 2022-08-08 DIAGNOSIS — N6310 Unspecified lump in the right breast, unspecified quadrant: Secondary | ICD-10-CM | POA: Diagnosis present

## 2022-08-08 DIAGNOSIS — N6322 Unspecified lump in the left breast, upper inner quadrant: Secondary | ICD-10-CM | POA: Diagnosis not present

## 2022-08-09 ENCOUNTER — Other Ambulatory Visit: Payer: Self-pay | Admitting: Family Medicine

## 2022-08-09 DIAGNOSIS — N63 Unspecified lump in unspecified breast: Secondary | ICD-10-CM

## 2022-08-09 DIAGNOSIS — R928 Other abnormal and inconclusive findings on diagnostic imaging of breast: Secondary | ICD-10-CM

## 2022-08-16 DIAGNOSIS — Z1371 Encounter for nonprocreative screening for genetic disease carrier status: Secondary | ICD-10-CM

## 2022-08-16 DIAGNOSIS — Z9189 Other specified personal risk factors, not elsewhere classified: Secondary | ICD-10-CM

## 2022-08-16 HISTORY — DX: Other specified personal risk factors, not elsewhere classified: Z91.89

## 2022-08-16 HISTORY — DX: Encounter for nonprocreative screening for genetic disease carrier status: Z13.71

## 2022-08-17 ENCOUNTER — Ambulatory Visit
Admission: RE | Admit: 2022-08-17 | Discharge: 2022-08-17 | Disposition: A | Discharge status: Home / Self Care | Payer: Managed Care, Other (non HMO) | Source: Ambulatory Visit | Attending: Family Medicine | Admitting: Family Medicine

## 2022-08-17 DIAGNOSIS — N63 Unspecified lump in unspecified breast: Secondary | ICD-10-CM

## 2022-08-17 DIAGNOSIS — R928 Other abnormal and inconclusive findings on diagnostic imaging of breast: Secondary | ICD-10-CM

## 2022-08-17 HISTORY — PX: BREAST BIOPSY: SHX20

## 2022-08-17 MED ORDER — LIDOCAINE-EPINEPHRINE 1 %-1:100000 IJ SOLN
8.0000 mL | Freq: Once | INTRAMUSCULAR | Status: AC
  Administered 2022-08-17: 8 mL
  Filled 2022-08-17: qty 8

## 2022-08-17 MED ORDER — LIDOCAINE HCL (PF) 1 % IJ SOLN
2.0000 mL | Freq: Once | INTRAMUSCULAR | Status: AC
  Administered 2022-08-17: 2 mL via INTRADERMAL
  Filled 2022-08-17: qty 2

## 2022-08-18 LAB — SURGICAL PATHOLOGY

## 2022-08-25 ENCOUNTER — Encounter: Payer: Self-pay | Admitting: Oncology

## 2022-08-31 ENCOUNTER — Encounter: Payer: Self-pay | Admitting: Obstetrics and Gynecology

## 2022-08-31 ENCOUNTER — Ambulatory Visit: Payer: Managed Care, Other (non HMO) | Admitting: Obstetrics and Gynecology

## 2022-08-31 ENCOUNTER — Other Ambulatory Visit (HOSPITAL_COMMUNITY)
Admission: RE | Admit: 2022-08-31 | Discharge: 2022-08-31 | Disposition: A | Discharge status: Home / Self Care | Payer: Managed Care, Other (non HMO) | Source: Ambulatory Visit | Attending: Obstetrics and Gynecology | Admitting: Obstetrics and Gynecology

## 2022-08-31 VITALS — BP 98/64 | Ht 65.0 in | Wt 161.0 lb

## 2022-08-31 DIAGNOSIS — F419 Anxiety disorder, unspecified: Secondary | ICD-10-CM

## 2022-08-31 DIAGNOSIS — Z124 Encounter for screening for malignant neoplasm of cervix: Secondary | ICD-10-CM | POA: Insufficient documentation

## 2022-08-31 DIAGNOSIS — Z803 Family history of malignant neoplasm of breast: Secondary | ICD-10-CM

## 2022-08-31 DIAGNOSIS — Z1151 Encounter for screening for human papillomavirus (HPV): Secondary | ICD-10-CM | POA: Insufficient documentation

## 2022-08-31 MED ORDER — SERTRALINE HCL 50 MG PO TABS
ORAL_TABLET | ORAL | 1 refills | Status: DC
Start: 2022-08-31 — End: 2022-10-23

## 2022-08-31 NOTE — Progress Notes (Addendum)
Smitty Cords, DO   Chief Complaint  Patient presents with   Pap Smear Only    HPI:      [name] is a 37 y.o. [id number] whose LMP was Patient's last menstrual period was 08/06/2022 (exact date)., presents today for pap smear only. Did annual with PCP. Neg pap 03/03/19. No hx of abn paps.   Menses are monthly, lasting 5 days, no BTB, no dysmen. She is sexually active, no pain/bleeding. Using condoms, trying to decide if wants any more children.   FH breast cancer on pat side. Pt doesn't technically qualify for cancer genetic testing but she has anxiety about FH, as well as personal hx of 2 fibroadenomas and neg breast biopsies (most recent 08/17/22). Pt states she has increased breast cancer risk >20% by model done with PCP. Tried to get scr breast MRI scheduled with PCP but not approved by insurance.   Pt with hx of anxiety since adolescence but worse past couple of yrs. Youngest daughter has health issues and this is very stressful for pt. Is seeing therapist but still gets panic attacks at times, also with worry/irritation. Would like to do medication. No SI.  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    Past Surgical History:  Procedure Laterality Date   BREAST BIOPSY Right 08/17/2022   [place] Core Ribbon Clip- path pending   BREAST BIOPSY Right 08/17/2022   [place] RT BREAST BX W LOC DEV 1ST LESION IMG BX SPEC US GUIDE 08/17/2022 [facility]   BREAST SURGERY     left breast mass, biopsy, benign       Family History  Problem Relation Age of Onset   Colon polyps Mother    Diabetes Paternal Grandmother    Breast cancer Paternal Grandmother        36s   Breast cancer Cousin        early 13s, triple negative type; gene neg   Breast cancer Other        passed in her 72s with breast cancer    Prostate cancer Maternal Uncle        doing treatment   Prostate cancer Maternal Uncle     Social History   Socioeconomic History   Marital status: Married    Spouse name: Not on file   Number of children: Not on file   Years of education: Not on file   Highest education level: Not on file  Occupational History   Not on file  Tobacco Use   Smoking status: Never   Smokeless tobacco: Never  Vaping Use   Vaping Use: Never used  Substance and Sexual Activity   Alcohol use: Yes    Alcohol/week: 1.0 - 2.0 standard drink of alcohol    Types: 1 - 2 Standard drinks or equivalent per week   Drug use: No   Sexual activity: Yes    Birth control/protection: None  Other Topics Concern   Not on file  Social History Narrative   Not on file   Social Determinants of Health   Financial Resource Strain: Not on file  Food Insecurity: Not on file  Transportation Needs: Not on file  Physical Activity: Not on file  Stress: Not on file  Social Connections: Not on file  Intimate Partner Violence: Not on file    Outpatient  Medications Prior to Visit  Medication Sig Dispense Refill   Multiple Vitamin (MULTIVITAMIN) LIQD Take 5 mLs by mouth daily.     No facility-administered medications prior to visit.      ROS:  Review of Systems  Constitutional:  Negative for fever.  Gastrointestinal:  Negative for blood in stool, constipation, diarrhea, nausea and vomiting.  Genitourinary:  Negative for dyspareunia, dysuria, flank pain, frequency, hematuria, urgency, vaginal bleeding, vaginal discharge and vaginal pain.  Musculoskeletal:  Negative for back pain.  Skin:  Negative for rash.  Psychiatric/Behavioral:  Positive for agitation.    BREAST: masses   OBJECTIVE:   Vitals:  BP 98/64   Ht 5\' 5"  (1.651 m)   Wt 161 lb (73 kg)   LMP 08/06/2022 (Exact Date)   BMI 26.79 kg/m   Physical Exam Vitals reviewed.  Constitutional:      Appearance: She is well-developed.  Pulmonary:      Effort: Pulmonary effort is normal.  Genitourinary:    General: Normal vulva.     Pubic Area: No rash.      Labia:        Right: No rash, tenderness or lesion.        Left: No rash, tenderness or lesion.      Vagina: Normal. No vaginal discharge, erythema or tenderness.     Cervix: Normal.     Uterus: Normal. Not enlarged and not tender.      Adnexa: Right adnexa normal and left adnexa normal.       Right: No mass or tenderness.         Left: No mass or tenderness.    Musculoskeletal:        General: Normal range of motion.     Cervical back: Normal range of motion.  Skin:    General: Skin is warm and dry.  Neurological:     General: No focal deficit present.     Mental Status: She is alert and oriented to person, place, and time.  Psychiatric:        Mood and Affect: Mood normal.        Behavior: Behavior normal.        Thought Content: Thought content normal.        Judgment: Judgment normal.     Results:    08/31/2022   10:53 AM 06/21/2021    9:15 AM 03/15/2021   10:35 AM 03/15/2021   10:23 AM  GAD 7 : Generalized Anxiety Score  Nervous, Anxious, on Edge 2 2 1 1   Control/stop worrying 2 1 1 1   Worry too much - different things 2 2 1 1   Trouble relaxing 2 1 1 1   Restless 2 0 0 0  Easily annoyed or irritable 3 2 1 1   Afraid - awful might happen 3 1 1 1   Total GAD 7 Score 16 9 6 6   Anxiety Difficulty Somewhat difficult Not difficult at all Not difficult at all Not difficult at all       08/31/2022   10:52 AM  Depression screen PHQ 2/9  Decreased Interest 1  Down, Depressed, Hopeless 1  PHQ - 2 Score 2  Altered sleeping 1  Tired, decreased energy 1  Change in appetite 1  Feeling bad or failure about yourself  0  Trouble concentrating 0  Moving slowly or fidgety/restless 0  Suicidal thoughts 0  PHQ-9 Score 5  Difficult doing work/chores Somewhat difficult      Assessment/Plan: Cervical cancer screening -  Plan: Cytology - PAP  Screening for HPV (human  papillomavirus) - Plan: Cytology - PAP  Family history of breast cancer - Plan: [facility]); MyRisk full panel testing discussed (d/t 13 specific genes for breast cancer) and done today. Pt aware she will most likely have to pay out of pocket since doesn't meed guidelines but will try anyway. Will f/u with results. Can then try to work on scr breast MRI auth if applicable. Also recommended breast imaging Q6 months so MRI best done 10/24.   Anxiety - Plan: sertraline (ZOLOFT) 50 MG tablet; tx options discussed. Given duration and severity of sx and GAD, will start SSRI. Rx zoloft, RTO in 7 wks for f/u/ sooner prn. Cont therapist.    Meds ordered this encounter  Medications   sertraline (ZOLOFT) 50 MG tablet    Sig: Take 1/2 tab daily for 6 days then 1 tab daily    Dispense:  30 tablet    Refill:  1    Order Specific Question:   Supervising Provider    Answer:   Hildred Laser [[id number]]      Return in about 7 weeks (around 10/19/2022) for anxiety f/u.  Tammy Barton B. Deaundra Dupriest, PA-C 08/31/2022 11:54 AM

## 2022-08-31 NOTE — Patient Instructions (Signed)
I value your feedback and you entrusting us with your care. If you get a  patient survey, I would appreciate you taking the time to let us know about your experience today. Thank you! ? ? ?

## 2022-09-04 LAB — CYTOLOGY - PAP
Comment: NEGATIVE
Diagnosis: NEGATIVE
High risk HPV: NEGATIVE

## 2022-09-12 ENCOUNTER — Encounter: Payer: Self-pay | Admitting: Obstetrics and Gynecology

## 2022-10-05 ENCOUNTER — Encounter: Payer: Self-pay | Admitting: Obstetrics and Gynecology

## 2022-10-05 DIAGNOSIS — Z1239 Encounter for other screening for malignant neoplasm of breast: Secondary | ICD-10-CM

## 2022-10-05 DIAGNOSIS — D249 Benign neoplasm of unspecified breast: Secondary | ICD-10-CM

## 2022-10-05 DIAGNOSIS — Z803 Family history of malignant neoplasm of breast: Secondary | ICD-10-CM

## 2022-10-05 DIAGNOSIS — Z9189 Other specified personal risk factors, not elsewhere classified: Secondary | ICD-10-CM

## 2022-10-05 NOTE — Telephone Encounter (Signed)
Had [facility] 09/26/22. Pt didn't get message.   MyRisk results discussed with pt today. IBIS=42.3%. Pt neg for mutation but has SDHD VUS.  Discussed recommendations of monthly SBE, Q6-12 month CBE, yearly mammos and breast MRI. Pt had mammo 4/24; s/p RT breast bx with fibroadenoma 5/24; can do yearly mammo. PCP having trouble getting MRI approved with insurance. Discussed prophylactic mastectomy and tamoxifen. Pt would like gen surg ref to discuss fibroadenoma exc vs mastectomy. Referred to Dr. Dwain Sarna. Order for MRI placed for 9/24. Pt to increase Vit D supp. Discussed stress and wt mgmt, limited alcohol use, exercise for breast protective measures.   Pt will call [facility] and speak to [facility]. Can do ref to [facility] vs [facility] prn.   Patient understands these results only apply to her and her children, and this is not indicative of genetic testing results of her other family members. It is recommended that her other family members have genetic testing done.  Pt also understands negative genetic testing doesn't mean she will never get any of these cancers.   Hard copy mailed to pt. F/u prn.

## 2022-10-20 NOTE — Progress Notes (Signed)
Smitty Cords, DO   No chief complaint on file.   HPI:      [name] is a 37 y.o. [phone] whose LMP was No LMP recorded., presents today for anxiety f/u, started sertraline 50 mg 5/24  Pt with hx of anxiety since adolescence but worse past couple of yrs. Youngest daughter has health issues and this is very stressful for pt. Is seeing therapist but still gets panic attacks at times, also with worry/irritation. Would like to do medication. No SI.   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    Past Surgical History:  Procedure Laterality Date   BREAST BIOPSY Right 08/17/2022   [place] Core Ribbon Clip- path pending   BREAST BIOPSY Right 08/17/2022   [place] RT BREAST BX W LOC DEV 1ST LESION IMG BX SPEC US GUIDE 08/17/2022 [facility]   BREAST SURGERY     left breast mass, biopsy, benign       Family History  Problem Relation Age of Onset   Colon polyps Mother    Diabetes Paternal Grandmother    Breast cancer Paternal Grandmother        45s   Breast cancer Cousin        early 67s, triple negative type; gene neg   Breast cancer Other        passed in her 57s with breast cancer   Prostate cancer Maternal Uncle        doing treatment   Prostate cancer Maternal Uncle     Social History   Socioeconomic History   Marital status: Married    Spouse name: Not on file   Number of children: Not on file   Years of education: Not on file   Highest education level: Not on file  Occupational History   Not on file  Tobacco Use   Smoking status: Never   Smokeless tobacco: Never  Vaping Use   Vaping Use: Never used  Substance and Sexual Activity   Alcohol use: Yes    Alcohol/week: 1.0 - 2.0 standard drink of alcohol    Types: 1 - 2 Standard drinks or equivalent per week   Drug use: No    Sexual activity: Yes    Birth control/protection: None  Other Topics Concern   Not on file  Social History Narrative   Not on file   Social Determinants of Health   Financial Resource Strain: Not on file  Food Insecurity: Not on file  Transportation Needs: Not on file  Physical Activity: Not on file  Stress: Not on file  Social Connections: Not on file  Intimate Partner Violence: Not on file    Outpatient Medications Prior to Visit  Medication Sig Dispense Refill   Multiple Vitamin (MULTIVITAMIN) LIQD Take 5 mLs by mouth daily.     sertraline (ZOLOFT) 50 MG tablet Take 1/2 tab daily for 6 days then 1 tab daily 30 tablet 1   No facility-administered medications prior to visit.      ROS:  Review of Systems BREAST: No symptoms   OBJECTIVE:   Vitals:  There were no vitals taken for this visit.  Physical Exam  Results: No results found for this or any previous visit (from the past 24 hour(s)).   Assessment/Plan: No diagnosis found.    No orders of the defined  types were placed in this encounter.     No follow-ups on file.  Remus Hagedorn B. Gordie Belvin, PA-C 10/20/2022 2:51 PM

## 2022-10-23 ENCOUNTER — Ambulatory Visit: Payer: Managed Care, Other (non HMO) | Admitting: Obstetrics and Gynecology

## 2022-10-23 ENCOUNTER — Encounter: Payer: Self-pay | Admitting: Obstetrics and Gynecology

## 2022-10-23 DIAGNOSIS — F419 Anxiety disorder, unspecified: Secondary | ICD-10-CM | POA: Diagnosis not present

## 2022-10-23 MED ORDER — SERTRALINE HCL 50 MG PO TABS
75.0000 mg | ORAL_TABLET | Freq: Every day | ORAL | 2 refills | Status: DC
Start: 2022-10-23 — End: 2023-02-01

## 2022-10-30 ENCOUNTER — Encounter: Payer: Self-pay | Admitting: Obstetrics and Gynecology

## 2022-10-30 DIAGNOSIS — Z9189 Other specified personal risk factors, not elsewhere classified: Secondary | ICD-10-CM

## 2022-10-30 DIAGNOSIS — Z803 Family history of malignant neoplasm of breast: Secondary | ICD-10-CM

## 2022-12-07 ENCOUNTER — Encounter: Payer: Self-pay | Admitting: Obstetrics and Gynecology

## 2022-12-15 ENCOUNTER — Other Ambulatory Visit: Payer: Self-pay | Admitting: General Surgery

## 2022-12-15 DIAGNOSIS — N6311 Unspecified lump in the right breast, upper outer quadrant: Secondary | ICD-10-CM

## 2022-12-21 ENCOUNTER — Other Ambulatory Visit: Payer: Self-pay | Admitting: General Surgery

## 2022-12-21 DIAGNOSIS — N6311 Unspecified lump in the right breast, upper outer quadrant: Secondary | ICD-10-CM

## 2022-12-28 ENCOUNTER — Other Ambulatory Visit: Payer: Self-pay | Admitting: Obstetrics and Gynecology

## 2022-12-28 ENCOUNTER — Ambulatory Visit
Admission: RE | Admit: 2022-12-28 | Discharge: 2022-12-28 | Disposition: A | Discharge status: Home / Self Care | Payer: Managed Care, Other (non HMO) | Source: Ambulatory Visit | Attending: Obstetrics and Gynecology | Admitting: Obstetrics and Gynecology

## 2022-12-28 DIAGNOSIS — R928 Other abnormal and inconclusive findings on diagnostic imaging of breast: Secondary | ICD-10-CM

## 2022-12-28 DIAGNOSIS — Z1239 Encounter for other screening for malignant neoplasm of breast: Secondary | ICD-10-CM

## 2022-12-28 DIAGNOSIS — Z803 Family history of malignant neoplasm of breast: Secondary | ICD-10-CM

## 2022-12-28 DIAGNOSIS — Z9189 Other specified personal risk factors, not elsewhere classified: Secondary | ICD-10-CM

## 2022-12-28 MED ORDER — GADOPICLENOL 0.5 MMOL/ML IV SOLN
7.0000 mL | Freq: Once | INTRAVENOUS | Status: AC | PRN
  Administered 2022-12-28: 7 mL via INTRAVENOUS

## 2022-12-29 NOTE — Progress Notes (Signed)
Pt has breast surgery scheduled with you in Oct. Wanted you to see her recent screening breast MRI due to increased risk of breast cancer due to family history. Thank you.

## 2023-01-01 ENCOUNTER — Other Ambulatory Visit: Payer: Self-pay | Admitting: Obstetrics and Gynecology

## 2023-01-01 DIAGNOSIS — F419 Anxiety disorder, unspecified: Secondary | ICD-10-CM

## 2023-01-03 ENCOUNTER — Other Ambulatory Visit (HOSPITAL_COMMUNITY): Payer: Self-pay | Admitting: Diagnostic Radiology

## 2023-01-03 ENCOUNTER — Ambulatory Visit
Admission: RE | Admit: 2023-01-03 | Discharge: 2023-01-03 | Disposition: A | Discharge status: Home / Self Care | Payer: Managed Care, Other (non HMO) | Source: Ambulatory Visit | Attending: Obstetrics and Gynecology | Admitting: Obstetrics and Gynecology

## 2023-01-03 ENCOUNTER — Ambulatory Visit
Admission: RE | Admit: 2023-01-03 | Discharge: 2023-01-03 | Disposition: A | Discharge status: Home / Self Care | Payer: Managed Care, Other (non HMO) | Source: Ambulatory Visit | Attending: Obstetrics and Gynecology

## 2023-01-03 DIAGNOSIS — R928 Other abnormal and inconclusive findings on diagnostic imaging of breast: Secondary | ICD-10-CM

## 2023-01-03 MED ORDER — GADOPICLENOL 0.5 MMOL/ML IV SOLN
7.5000 mL | Freq: Once | INTRAVENOUS | Status: AC | PRN
  Administered 2023-01-03: 7.5 mL via INTRAVENOUS

## 2023-01-04 LAB — SURGICAL PATHOLOGY

## 2023-01-17 ENCOUNTER — Other Ambulatory Visit: Payer: Self-pay | Admitting: General Surgery

## 2023-01-17 ENCOUNTER — Encounter (HOSPITAL_BASED_OUTPATIENT_CLINIC_OR_DEPARTMENT_OTHER): Payer: Self-pay | Admitting: General Surgery

## 2023-01-17 ENCOUNTER — Other Ambulatory Visit: Payer: Self-pay

## 2023-01-17 DIAGNOSIS — D241 Benign neoplasm of right breast: Secondary | ICD-10-CM

## 2023-01-17 MED ORDER — CHLORHEXIDINE GLUCONATE CLOTH 2 % EX PADS: 6.0000 | MEDICATED_PAD | Freq: Once | CUTANEOUS | Status: DC

## 2023-01-17 NOTE — Progress Notes (Signed)

## 2023-01-18 ENCOUNTER — Other Ambulatory Visit: Payer: Self-pay | Admitting: General Surgery

## 2023-01-18 ENCOUNTER — Encounter: Payer: Self-pay | Admitting: General Surgery

## 2023-01-18 DIAGNOSIS — N6311 Unspecified lump in the right breast, upper outer quadrant: Secondary | ICD-10-CM

## 2023-01-19 ENCOUNTER — Ambulatory Visit
Admission: RE | Admit: 2023-01-19 | Discharge: 2023-01-19 | Disposition: A | Discharge status: Home / Self Care | Payer: Managed Care, Other (non HMO) | Source: Ambulatory Visit | Attending: General Surgery | Admitting: General Surgery

## 2023-01-19 ENCOUNTER — Encounter: Payer: Self-pay | Admitting: Oncology

## 2023-01-19 DIAGNOSIS — N6311 Unspecified lump in the right breast, upper outer quadrant: Secondary | ICD-10-CM

## 2023-01-19 DIAGNOSIS — D241 Benign neoplasm of right breast: Secondary | ICD-10-CM

## 2023-01-19 HISTORY — PX: BREAST BIOPSY: SHX20

## 2023-01-23 ENCOUNTER — Encounter: Payer: Self-pay | Admitting: Family Medicine

## 2023-01-23 NOTE — H&P (Signed)
37 year old female who has a significant family history of breast cancer. She has breast cancer in a paternal grandmother at 30, paternal great aunt at 21 who is deceased, paternal great grandmother with what she believes is uterine cancer, paternal second cousin at age 29 with a triple negative breast cancer, mom with melanoma in her 57s, and a maternal uncle with prostate cancer. She has personally undergone myriad genetic testing. She has a VUS in SDHD but no other abnormalities were noted. When she had this done she also had her risk calculated and her Tyrer-Cuzick risk was calculated out as 42%. She was then referred over for evaluation. Her last imaging was done in April 2024. She has B density breast tissue. She has a circumscribed oval mass in the upper right breast noted. A targeted ultrasound shows a 1.2 cm mass. There are no abnormal right axillary lymph nodes. The left breast there where she was concerned has no abnormalities at all. She underwent an ultrasound-guided biopsy of this area. This is concordant and is read as a fibroadenoma without any atypia or any malignancy. We elected to excise this. She then had an MRI. This showed indeterminate 1 cm nonmasslike enhancement within UOQ of right breast and indeterminate 1 cm mass in outer central right breast. Left negative. Biopsy of both done. Upper outer cylinder clip is FA with microcalcs and fibroadenomatoid changes. Biopsy of other is FA. She is here to discuss this  Medical History: Past Medical History:  Diagnosis Date  Anxiety  Familial hemochromatosis (CMS-HCC)  Fibroadenoma   Patient Active Problem List  Diagnosis  Fibroadenoma of breast, right   Past Surgical History:  Procedure Laterality Date  OPEN EXCISION BREAST LESION Left 08/21/2013  Procedure: OPEN EXCISION BREAST LESION; Surgeon: Brayton Mars, MD; Location: [facility] OR; Service: General Surgery; Laterality: Left;  wisdom teeth extraction   No Known  Allergies  Current Outpatient Medications on File Prior to Visit  Medication Sig Dispense Refill  calcium phosphate-vitamin D2 (CHILDREN'S CALCIUM GUMMIES) 100 mg calcium -100 unit Chew Take 1 tablet by mouth daily.  cholecalciferol 1000 unit tablet Take by mouth  MULTIVITAMIN ORAL Take 1 tablet by mouth daily.  norgest-ethinyl estradiol triphasic (TRINESSA, 28,) 0.18/0.215/0.25 mg-35 mcg (28) tablet Take 1 tablet by mouth once daily. 28 tablet 12  sertraline (ZOLOFT) 50 MG tablet Take 75 mg by mouth once daily    Family History  Problem Relation Age of Onset  Melanoma Mother  Endometriosis Mother  Hyperlipidemia (Elevated cholesterol) Father    Social History   Tobacco Use  Smoking Status Never  Smokeless Tobacco Never  Marital status: Single  Tobacco Use  Smoking status: Never  Smokeless tobacco: Never  Substance and Sexual Activity  Alcohol use: Yes  Alcohol/week: 2.0 standard drinks of alcohol  Types: 2 Cans of beer per week  Drug use: No  Sexual activity: Yes  Partners: Male  Birth control/protection: OCP   Objective:   Physical Exam Vitals reviewed.  Constitutional:  Appearance: Normal appearance.  Chest:  Breasts: Right: No inverted nipple, mass or nipple discharge.  Left: No inverted nipple, mass or nipple discharge.  Lymphadenopathy:  Upper Body:  Right upper body: No supraclavicular or axillary adenopathy.  Left upper body: No supraclavicular or axillary adenopathy.  Neurological:  Mental Status: She is alert.   Assessment and Plan:   Fibroadenoma of breast, right  We discussed all of the options today. 1 of these was to excise all 3 areas or just follow the areas.  She would like to have all 3 of these removed and not just the one area. We discussed an excisional biopsy x 3 with radioactive seed guidance and we will plan on changing the surgery to that.

## 2023-01-24 ENCOUNTER — Ambulatory Visit (HOSPITAL_BASED_OUTPATIENT_CLINIC_OR_DEPARTMENT_OTHER): Payer: Self-pay | Admitting: Anesthesiology

## 2023-01-24 ENCOUNTER — Other Ambulatory Visit: Payer: Self-pay

## 2023-01-24 ENCOUNTER — Ambulatory Visit
Admission: RE | Admit: 2023-01-24 | Discharge: 2023-01-24 | Disposition: A | Discharge status: Home / Self Care | Payer: Managed Care, Other (non HMO) | Source: Ambulatory Visit | Attending: General Surgery | Admitting: General Surgery

## 2023-01-24 ENCOUNTER — Encounter (HOSPITAL_BASED_OUTPATIENT_CLINIC_OR_DEPARTMENT_OTHER)
Admission: RE | Disposition: A | Discharge status: Home / Self Care | Payer: Self-pay | Source: Home / Self Care | Attending: General Surgery

## 2023-01-24 ENCOUNTER — Encounter (HOSPITAL_BASED_OUTPATIENT_CLINIC_OR_DEPARTMENT_OTHER): Payer: Self-pay | Admitting: General Surgery

## 2023-01-24 ENCOUNTER — Ambulatory Visit (HOSPITAL_BASED_OUTPATIENT_CLINIC_OR_DEPARTMENT_OTHER): Payer: Managed Care, Other (non HMO) | Admitting: Anesthesiology

## 2023-01-24 ENCOUNTER — Ambulatory Visit (HOSPITAL_BASED_OUTPATIENT_CLINIC_OR_DEPARTMENT_OTHER)
Admission: RE | Admit: 2023-01-24 | Discharge: 2023-01-24 | Disposition: A | Discharge status: Home / Self Care | Payer: Managed Care, Other (non HMO) | Attending: General Surgery | Admitting: General Surgery

## 2023-01-24 DIAGNOSIS — D241 Benign neoplasm of right breast: Secondary | ICD-10-CM

## 2023-01-24 DIAGNOSIS — Z803 Family history of malignant neoplasm of breast: Secondary | ICD-10-CM | POA: Insufficient documentation

## 2023-01-24 DIAGNOSIS — F419 Anxiety disorder, unspecified: Secondary | ICD-10-CM | POA: Diagnosis not present

## 2023-01-24 DIAGNOSIS — Z01818 Encounter for other preprocedural examination: Secondary | ICD-10-CM

## 2023-01-24 DIAGNOSIS — N6311 Unspecified lump in the right breast, upper outer quadrant: Secondary | ICD-10-CM

## 2023-01-24 HISTORY — DX: Hemochromatosis, unspecified: E83.119

## 2023-01-24 HISTORY — DX: Anxiety disorder, unspecified: F41.9

## 2023-01-24 HISTORY — PX: RADIOACTIVE SEED GUIDED EXCISIONAL BREAST BIOPSY: SHX6490

## 2023-01-24 LAB — POCT PREGNANCY, URINE: Preg Test, Ur: NEGATIVE

## 2023-01-24 SURGERY — RADIOACTIVE SEED GUIDED BREAST BIOPSY
Anesthesia: General | Site: Breast | Laterality: Right

## 2023-01-24 MED ORDER — MIDAZOLAM HCL 2 MG/2ML IJ SOLN
INTRAMUSCULAR | Status: AC
  Filled 2023-01-24: qty 2

## 2023-01-24 MED ORDER — ONDANSETRON HCL 4 MG/2ML IJ SOLN
INTRAMUSCULAR | Status: AC
  Filled 2023-01-24: qty 2

## 2023-01-24 MED ORDER — FENTANYL CITRATE (PF) 100 MCG/2ML IJ SOLN
INTRAMUSCULAR | Status: DC | PRN
  Administered 2023-01-24: 50 ug via INTRAVENOUS
  Administered 2023-01-24 (×2): 25 ug via INTRAVENOUS

## 2023-01-24 MED ORDER — OXYCODONE HCL 5 MG PO TABS
5.0000 mg | ORAL_TABLET | Freq: Once | ORAL | Status: AC
  Administered 2023-01-24: 5 mg via ORAL

## 2023-01-24 MED ORDER — FENTANYL CITRATE (PF) 100 MCG/2ML IJ SOLN
INTRAMUSCULAR | Status: AC
  Filled 2023-01-24: qty 2

## 2023-01-24 MED ORDER — PROPOFOL 500 MG/50ML IV EMUL
INTRAVENOUS | Status: DC | PRN
Start: 2023-01-24 — End: 2023-01-24
  Administered 2023-01-24: 150 ug/kg/min via INTRAVENOUS

## 2023-01-24 MED ORDER — CHLORHEXIDINE GLUCONATE CLOTH 2 % EX PADS: 6.0000 | MEDICATED_PAD | Freq: Once | CUTANEOUS | Status: DC

## 2023-01-24 MED ORDER — CEFAZOLIN SODIUM-DEXTROSE 2-4 GM/100ML-% IV SOLN: 2.0000 g | INTRAVENOUS | Status: DC

## 2023-01-24 MED ORDER — MIDAZOLAM HCL 5 MG/5ML IJ SOLN
INTRAMUSCULAR | Status: DC | PRN
  Administered 2023-01-24: 2 mg via INTRAVENOUS

## 2023-01-24 MED ORDER — OXYCODONE HCL 5 MG PO TABS
ORAL_TABLET | ORAL | Status: AC
  Filled 2023-01-24: qty 1

## 2023-01-24 MED ORDER — CEFAZOLIN SODIUM-DEXTROSE 2-4 GM/100ML-% IV SOLN
2.0000 g | INTRAVENOUS | Status: AC
  Administered 2023-01-24: 2 g via INTRAVENOUS

## 2023-01-24 MED ORDER — ONDANSETRON HCL 4 MG/2ML IJ SOLN
INTRAMUSCULAR | Status: DC | PRN
  Administered 2023-01-24: 4 mg via INTRAVENOUS

## 2023-01-24 MED ORDER — PROPOFOL 10 MG/ML IV BOLUS
INTRAVENOUS | Status: AC
  Filled 2023-01-24: qty 20

## 2023-01-24 MED ORDER — PROPOFOL 10 MG/ML IV BOLUS
INTRAVENOUS | Status: DC | PRN
  Administered 2023-01-24: 200 mg via INTRAVENOUS

## 2023-01-24 MED ORDER — DEXAMETHASONE SODIUM PHOSPHATE 10 MG/ML IJ SOLN
INTRAMUSCULAR | Status: DC | PRN
Start: 2023-01-24 — End: 2023-01-24
  Administered 2023-01-24: 10 mg via INTRAVENOUS

## 2023-01-24 MED ORDER — ACETAMINOPHEN 500 MG PO TABS
ORAL_TABLET | ORAL | Status: AC
  Filled 2023-01-24: qty 2

## 2023-01-24 MED ORDER — ACETAMINOPHEN 500 MG PO TABS
1000.0000 mg | ORAL_TABLET | ORAL | Status: AC
  Administered 2023-01-24: 1000 mg via ORAL

## 2023-01-24 MED ORDER — LACTATED RINGERS IV SOLN
INTRAVENOUS | Status: DC | PRN
Start: 2023-01-24 — End: 2023-01-24

## 2023-01-24 MED ORDER — LACTATED RINGERS IV SOLN: INTRAVENOUS | Status: DC

## 2023-01-24 MED ORDER — LIDOCAINE 2% (20 MG/ML) 5 ML SYRINGE
INTRAMUSCULAR | Status: DC | PRN
  Administered 2023-01-24: 60 mg via INTRAVENOUS

## 2023-01-24 MED ORDER — ENSURE PRE-SURGERY PO LIQD: 296.0000 mL | Freq: Once | ORAL | Status: DC

## 2023-01-24 MED ORDER — BUPIVACAINE HCL (PF) 0.25 % IJ SOLN
INTRAMUSCULAR | Status: DC | PRN
  Administered 2023-01-24: 10 mL

## 2023-01-24 MED ORDER — BUPIVACAINE HCL (PF) 0.25 % IJ SOLN
INTRAMUSCULAR | Status: AC
  Filled 2023-01-24: qty 60

## 2023-01-24 MED ORDER — CEFAZOLIN SODIUM-DEXTROSE 2-4 GM/100ML-% IV SOLN
INTRAVENOUS | Status: AC
  Filled 2023-01-24: qty 100

## 2023-01-24 MED ORDER — FENTANYL CITRATE (PF) 100 MCG/2ML IJ SOLN
25.0000 ug | INTRAMUSCULAR | Status: DC | PRN
  Administered 2023-01-24: 25 ug via INTRAVENOUS

## 2023-01-24 SURGICAL SUPPLY — 61 items
ADH SKN CLS APL DERMABOND .7 (GAUZE/BANDAGES/DRESSINGS) ×1
APL PRP STRL LF DISP 70% ISPRP (MISCELLANEOUS) ×1
APPLIER CLIP 9.375 MED OPEN (MISCELLANEOUS)
APR CLP MED 9.3 20 MLT OPN (MISCELLANEOUS)
BINDER BREAST LRG (GAUZE/BANDAGES/DRESSINGS) IMPLANT
BINDER BREAST MEDIUM (GAUZE/BANDAGES/DRESSINGS) IMPLANT
BINDER BREAST XLRG (GAUZE/BANDAGES/DRESSINGS) IMPLANT
BINDER BREAST XXLRG (GAUZE/BANDAGES/DRESSINGS) IMPLANT
BLADE SURG 15 STRL LF DISP TIS (BLADE) ×1 IMPLANT
BLADE SURG 15 STRL SS (BLADE) ×1
CANISTER SUC SOCK COL 7IN (MISCELLANEOUS) IMPLANT
CANISTER SUCT 1200ML W/VALVE (MISCELLANEOUS) IMPLANT
CHLORAPREP W/TINT 26 (MISCELLANEOUS) ×1 IMPLANT
CLIP APPLIE 9.375 MED OPEN (MISCELLANEOUS) IMPLANT
CLIP TI WIDE RED SMALL 6 (CLIP) IMPLANT
COVER BACK TABLE 60X90IN (DRAPES) ×1 IMPLANT
COVER MAYO STAND STRL (DRAPES) ×1 IMPLANT
COVER PROBE CYLINDRICAL 5X96 (MISCELLANEOUS) ×1 IMPLANT
DERMABOND ADVANCED .7 DNX12 (GAUZE/BANDAGES/DRESSINGS) ×1 IMPLANT
DRAPE LAPAROSCOPIC ABDOMINAL (DRAPES) ×1 IMPLANT
DRAPE UTILITY XL STRL (DRAPES) ×1 IMPLANT
DRSG TEGADERM 4X4.75 (GAUZE/BANDAGES/DRESSINGS) IMPLANT
ELECT COATED BLADE 2.86 ST (ELECTRODE) ×1 IMPLANT
ELECT REM PT RETURN 9FT ADLT (ELECTROSURGICAL) ×1
ELECTRODE REM PT RTRN 9FT ADLT (ELECTROSURGICAL) ×1 IMPLANT
GAUZE SPONGE 4X4 12PLY STRL LF (GAUZE/BANDAGES/DRESSINGS) IMPLANT
GLOVE BIO SURGEON STRL SZ7 (GLOVE) ×2 IMPLANT
GLOVE BIOGEL PI IND STRL 7.0 (GLOVE) IMPLANT
GLOVE BIOGEL PI IND STRL 7.5 (GLOVE) ×1 IMPLANT
GLOVE SURG SS PI 7.5 STRL IVOR (GLOVE) IMPLANT
GLOVE SURG SYN 7.5 E (GLOVE) ×1
GLOVE SURG SYN 7.5 PF PI (GLOVE) IMPLANT
GOWN SPEC L3 XXLG W/TWL (GOWN DISPOSABLE) IMPLANT
GOWN STRL REUS W/ TWL LRG LVL3 (GOWN DISPOSABLE) ×2 IMPLANT
GOWN STRL REUS W/ TWL XL LVL3 (GOWN DISPOSABLE) IMPLANT
GOWN STRL REUS W/TWL LRG LVL3 (GOWN DISPOSABLE) ×2
GOWN STRL REUS W/TWL XL LVL3 (GOWN DISPOSABLE) ×1
HEMOSTAT ARISTA ABSORB 3G PWDR (HEMOSTASIS) IMPLANT
KIT MARKER MARGIN INK (KITS) ×1 IMPLANT
NDL HYPO 25X1 1.5 SAFETY (NEEDLE) ×1 IMPLANT
NEEDLE HYPO 25X1 1.5 SAFETY (NEEDLE) ×1
NS IRRIG 1000ML POUR BTL (IV SOLUTION) IMPLANT
PACK BASIN DAY SURGERY FS (CUSTOM PROCEDURE TRAY) ×1 IMPLANT
PENCIL SMOKE EVACUATOR (MISCELLANEOUS) ×1 IMPLANT
RETRACTOR ONETRAX LX 90X20 (MISCELLANEOUS) IMPLANT
SLEEVE SCD COMPRESS KNEE MED (STOCKING) ×1 IMPLANT
SPIKE FLUID TRANSFER (MISCELLANEOUS) IMPLANT
SPONGE T-LAP 4X18 ~~LOC~~+RFID (SPONGE) ×1 IMPLANT
STRIP CLOSURE SKIN 1/2X4 (GAUZE/BANDAGES/DRESSINGS) ×1 IMPLANT
SUT MNCRL AB 4-0 PS2 18 (SUTURE) IMPLANT
SUT MON AB 5-0 PS2 18 (SUTURE) IMPLANT
SUT SILK 2 0 SH (SUTURE) IMPLANT
SUT VIC AB 2-0 SH 27 (SUTURE) ×2
SUT VIC AB 2-0 SH 27XBRD (SUTURE) ×1 IMPLANT
SUT VIC AB 3-0 SH 27 (SUTURE) ×1
SUT VIC AB 3-0 SH 27X BRD (SUTURE) ×1 IMPLANT
SYR CONTROL 10ML LL (SYRINGE) ×1 IMPLANT
TOWEL GREEN STERILE FF (TOWEL DISPOSABLE) ×1 IMPLANT
TRAY FAXITRON CT DISP (TRAY / TRAY PROCEDURE) ×1 IMPLANT
TUBE CONNECTING 20X1/4 (TUBING) IMPLANT
YANKAUER SUCT BULB TIP NO VENT (SUCTIONS) IMPLANT

## 2023-01-24 NOTE — Anesthesia Postprocedure Evaluation (Signed)
Anesthesia Post Note  Patient: Tammy Barton  Procedure(s) Performed: RIGHT BREAST SEED GUIDED EXCISIONAL BIOPSY x3 (Right: Breast)     Patient location during evaluation: PACU Anesthesia Type: General Level of consciousness: awake and alert Pain management: pain level controlled Vital Signs Assessment: post-procedure vital signs reviewed and stable Respiratory status: spontaneous breathing, nonlabored ventilation, respiratory function stable and patient connected to nasal cannula oxygen Cardiovascular status: blood pressure returned to baseline and stable Postop Assessment: no apparent nausea or vomiting Anesthetic complications: no  No notable events documented.  Last Vitals:  Vitals:   01/24/23 1030 01/24/23 1052  BP: 100/72 109/74  Pulse: 60 (!) 55  Resp: 12 16  Temp:  36.7 C  SpO2: 92% 95%    Last Pain:  Vitals:   01/24/23 1046  TempSrc:   PainSc: 4                  Maricus Tanzi L Bran Aldridge

## 2023-01-24 NOTE — Op Note (Signed)
Preoperative diagnosis: 3 right breast fibroadenomas Postoperative diagnosis: same as above Procedure; RIght breast seed guided excisional biopsy x3 Surgeon: Dr Harden Mo Estimated blood loss: Minimal Complications: None Drains: None Specimens: 3 separate lesions sent with all the clips and all the seeds accounted for, marked with paint Anesthesia: General Sponge needle count was correct at completion Disposition to recovery stable condition  Indications:37 year old female who has B density breast tissue. She has a circumscribed oval mass in the upper right breast noted. A targeted ultrasound shows a 1.2 cm mass. There are no abnormal right axillary lymph nodes. She underwent an ultrasound-guided biopsy of this area. This is concordant and is read as a fibroadenoma without any atypia or any malignancy. We elected to excise this. She then had an MRI. This showed indeterminate 1 cm nonmasslike enhancement within UOQ of right breast and indeterminate 1 cm mass in outer central right breast. Left negative. Biopsy of both done. Upper outer cylinder clip is FA with microcalcs and fibroadenomatoid changes. Biopsy of other is FA.  She wanted to proceed with excision of all of these areas.  Procedure: After informed consent was obtained she was taken to the operating room.  Antibiotics were given.  SCDs were in place.  She had had 3 seeds placed prior to beginning and I had these mammograms available in the operating room.  She was placed under general anesthesia without complication.  She was prepped and draped in standard sterile surgical fashion.  Surgical timeout was then performed.  I made a single periareolar incision after infiltrating Marcaine in order to hide the scar later.  I then remove the central seed and the ribbon clip.  This was marked with pain and confirmed by mammography.  I then removed the lateral 2 lesions which were the cylinder clip and the hourglass clip.  These were both  removed separately.  I confirmed removal of the seed and the clip at both locations.  These were both marked with pain as well.  All 3 of these were sent to pathology.  I then obtained hemostasis.  I closed down the breast tissue to obliterate the defect with 2-0 Vicryl suture.  The skin was closed with 3-0 Vicryl and 5-0 Monocryl.  Glue and Steri-Strips were applied.  She tolerated this well was extubated and transferred recovery stable.

## 2023-01-24 NOTE — Discharge Instructions (Addendum)
[facility] Office Phone Number [phone]  POST OP INSTRUCTIONS Take 400 mg of ibuprofen every 8 hours or 650 mg tylenol every 6 hours for next 72 hours then as needed. Use ice several times daily also.  A prescription for pain medication may be given to you upon discharge.  Take your pain medication as prescribed, if needed.  If narcotic pain medicine is not needed, then you may take acetaminophen (Tylenol), naprosyn (Alleve) or ibuprofen (Advil) as needed. Take your usually prescribed medications unless otherwise directed If you need a refill on your pain medication, please contact your pharmacy.  They will contact our office to request authorization.  Prescriptions will not be filled after 5pm or on week-ends. You should eat very light the first 24 hours after surgery, such as soup, crackers, pudding, etc.  Resume your normal diet the day after surgery. Most patients will experience some swelling and bruising in the breast.  Ice packs and a good support bra will help.  Wear the breast binder provided or a sports bra for 72 hours day and night.  After that wear a sports bra during the day until you return to the office. Swelling and bruising can take several days to resolve.  It is common to experience some constipation if taking pain medication after surgery.  Increasing fluid intake and taking a stool softener will usually help or prevent this problem from occurring.  A mild laxative (Milk of Magnesia or Miralax) should be taken according to package directions if there are no bowel movements after 48 hours. I used skin glue on the incision, you may shower in 24 hours.  The glue will flake off over the next 2-3 weeks.  Any sutures or staples will be removed at the office during your follow-up visit. ACTIVITIES:  You may resume regular daily activities (gradually increasing) beginning the next day.  Wearing a good support bra or sports bra minimizes pain and swelling.  You may have  sexual intercourse when it is comfortable. You may drive when you no longer are taking prescription pain medication, you can comfortably wear a seatbelt, and you can safely maneuver your car and apply brakes. RETURN TO WORK:  ______________________________________________________________________________________ Tammy Barton should see your doctor in the office for a follow-up appointment approximately two weeks after your surgery.  Your doctor's nurse will typically make your follow-up appointment when she calls you with your pathology report.  Expect your pathology report 3-4 business days after your surgery.  You may call to check if you do not hear from [place] after three days. OTHER INSTRUCTIONS: _______________________________________________________________________________________________ _____________________________________________________________________________________________________________________________________ _____________________________________________________________________________________________________________________________________ _____________________________________________________________________________________________________________________________________  WHEN TO CALL DR WAKEFIELD: Fever over 101.0 Nausea and/or vomiting. Extreme swelling or bruising. Continued bleeding from incision. Increased pain, redness, or drainage from the incision.  The clinic staff is available to answer your questions during regular business hours.  Please don't hesitate to call and ask to speak to one of the nurses for clinical concerns.  If you have a medical emergency, go to the nearest emergency room or call 911.  A surgeon from [facility] is always on call at the hospital.  For further questions, please visit [email or web address]  May take Tylenol 1:45 pm, if needed    Post Anesthesia Home Care Instructions  Activity: Get plenty of rest for the remainder of the  day. A responsible individual must stay with you for 24 hours following the procedure.  For the next 24 hours, DO NOT: -Drive a car Environmental consultant -  Drink alcoholic beverages -Take any medication unless instructed by your physician -Make any legal decisions or sign important papers.  Meals: Start with liquid foods such as gelatin or soup. Progress to regular foods as tolerated. Avoid greasy, spicy, heavy foods. If nausea and/or vomiting occur, drink only clear liquids until the nausea and/or vomiting subsides. Call your physician if vomiting continues.  Special Instructions/Symptoms: Your throat may feel dry or sore from the anesthesia or the breathing tube placed in your throat during surgery. If this causes discomfort, gargle with warm salt water. The discomfort should disappear within 24 hours.  If you had a scopolamine patch placed behind your ear for the management of post- operative nausea and/or vomiting:  1. The medication in the patch is effective for 72 hours, after which it should be removed.  Wrap patch in a tissue and discard in the trash. Wash hands thoroughly with soap and water. 2. You may remove the patch earlier than 72 hours if you experience unpleasant side effects which may include dry mouth, dizziness or visual disturbances. 3. Avoid touching the patch. Wash your hands with soap and water after contact with the patch.

## 2023-01-24 NOTE — Anesthesia Procedure Notes (Signed)
Procedure Name: LMA Insertion Date/Time: 01/24/2023 8:43 AM  Performed by: Thornell Mule, CRNAPre-anesthesia Checklist: Patient identified, Emergency Drugs available, Suction available and Patient being monitored Patient Re-evaluated:Patient Re-evaluated prior to induction Oxygen Delivery Method: Circle system utilized Induction Type: IV induction LMA Size: 4.0 Number of attempts: 1 Placement Confirmation: positive ETCO2 Tube secured with: Tape Dental Injury: Teeth and Oropharynx as per pre-operative assessment

## 2023-01-24 NOTE — Transfer of Care (Signed)
Immediate Anesthesia Transfer of Care Note  Patient: Tammy Barton  Procedure(s) Performed: RIGHT BREAST SEED GUIDED EXCISIONAL BIOPSY x3 (Right: Breast)  Patient Location: PACU  Anesthesia Type:General  Level of Consciousness: drowsy and patient cooperative  Airway & Oxygen Therapy: Patient Spontanous Breathing and Patient connected to face mask oxygen  Post-op Assessment: Report given to RN and Post -op Vital signs reviewed and stable  Post vital signs: Reviewed and stable  Last Vitals:  Vitals Value Taken Time  BP    Temp    Pulse 61 01/24/23 0946  Resp    SpO2 98 % 01/24/23 0946  Vitals shown include unfiled device data.  Last Pain:  Vitals:   01/24/23 0742  TempSrc: Temporal  PainSc: 0-No pain         Complications: No notable events documented.

## 2023-01-24 NOTE — Anesthesia Preprocedure Evaluation (Addendum)
Anesthesia Evaluation  Patient identified by MRN, date of birth, ID band Patient awake    Reviewed: Allergy & Precautions, NPO status , Patient's Chart, lab work & pertinent test results  Airway Mallampati: I  TM Distance: >3 FB Neck ROM: Full    Dental no notable dental hx. (+) Teeth Intact, Dental Advisory Given   Pulmonary neg pulmonary ROS   Pulmonary exam normal breath sounds clear to auscultation       Cardiovascular negative cardio ROS Normal cardiovascular exam Rhythm:Regular Rate:Normal     Neuro/Psych  PSYCHIATRIC DISORDERS Anxiety     negative neurological ROS     GI/Hepatic negative GI ROS,,,Hemochromatosis   Endo/Other  negative endocrine ROS    Renal/GU negative Renal ROS  negative genitourinary   Musculoskeletal negative musculoskeletal ROS (+)    Abdominal   Peds  Hematology negative hematology ROS (+)   Anesthesia Other Findings   Reproductive/Obstetrics                             Anesthesia Physical Anesthesia Plan  ASA: 2  Anesthesia Plan: General   Post-op Pain Management: Tylenol PO (pre-op)*   Induction: Intravenous  PONV Risk Score and Plan: 3 and Ondansetron, Dexamethasone, Midazolam and TIVA  Airway Management Planned: LMA  Additional Equipment:   Intra-op Plan:   Post-operative Plan: Extubation in OR  Informed Consent: I have reviewed the patients History and Physical, chart, labs and discussed the procedure including the risks, benefits and alternatives for the proposed anesthesia with the patient or authorized representative who has indicated his/her understanding and acceptance.     Dental advisory given  Plan Discussed with: CRNA  Anesthesia Plan Comments:        Anesthesia Quick Evaluation

## 2023-01-24 NOTE — Interval H&P Note (Signed)
History and Physical Interval Note:  01/24/2023 7:53 AM  Tammy Barton  has presented today for surgery, with the diagnosis of RIGHT BREAST MASS.  The various methods of treatment have been discussed with the patient and family. After consideration of risks, benefits and other options for treatment, the patient has consented to  Procedure(s) with comments: RIGHT BREAST SEED GUIDED EXCISIONAL BIOPSY TIMES 3 (Right) - LMA as a surgical intervention.  The patient's history has been reviewed, patient examined, no change in status, stable for surgery.  I have reviewed the patient's chart and labs.  Questions were answered to the patient's satisfaction.     Emelia Loron

## 2023-01-25 ENCOUNTER — Encounter (HOSPITAL_BASED_OUTPATIENT_CLINIC_OR_DEPARTMENT_OTHER): Payer: Self-pay | Admitting: General Surgery

## 2023-01-25 LAB — SURGICAL PATHOLOGY

## 2023-02-01 ENCOUNTER — Encounter: Payer: Self-pay | Admitting: Obstetrics and Gynecology

## 2023-02-01 DIAGNOSIS — F419 Anxiety disorder, unspecified: Secondary | ICD-10-CM

## 2023-02-01 MED ORDER — SERTRALINE HCL 100 MG PO TABS
100.0000 mg | ORAL_TABLET | Freq: Every day | ORAL | 2 refills | Status: DC
Start: 2023-02-01 — End: 2023-11-15

## 2023-02-07 ENCOUNTER — Encounter: Payer: Self-pay | Admitting: Family Medicine

## 2023-02-07 ENCOUNTER — Telehealth (INDEPENDENT_AMBULATORY_CARE_PROVIDER_SITE_OTHER): Payer: Managed Care, Other (non HMO) | Admitting: Family Medicine

## 2023-02-07 DIAGNOSIS — G4719 Other hypersomnia: Secondary | ICD-10-CM | POA: Diagnosis not present

## 2023-02-07 DIAGNOSIS — R29818 Other symptoms and signs involving the nervous system: Secondary | ICD-10-CM | POA: Diagnosis not present

## 2023-02-07 NOTE — Patient Instructions (Addendum)
Home Sleep study ordered to [facility]  Please schedule a Follow-up Appointment to: Return if symptoms worsen or fail to improve.  If you have any other questions or concerns, please feel free to call the office or send a message through MyChart. You may also schedule an earlier appointment if necessary.  Additionally, you may be receiving a survey about your experience at our office within a few days to 1 week by e-mail or mail. We value your feedback.  Saralyn Pilar, DO [facility], [place]

## 2023-02-07 NOTE — Progress Notes (Signed)
Subjective:    Patient ID: Nelva Bush, female    DOB: 1985-08-10, 37 y.o.   MRN: [id number]  Hawwa Westby is a 37 y.o. female presenting on 02/07/2023 for No chief complaint on file.   HPI  Virtual / Telehealth Encounter - Video Visit via MyChart The purpose of this virtual visit is to provide medical care while limiting exposure to the novel coronavirus (COVID19) for both patient and office staff.  Consent was obtained for remote visit:  Yes.   Answered questions that patient had about telehealth interaction:  Yes.   I discussed the limitations, risks, security and privacy concerns of performing an evaluation and management service by video/telephone. I also discussed with the patient that there may be a patient responsible charge related to this service. The patient expressed understanding and agreed to proceed.  Patient Location: Home Provider Location: [facility] (Office)  Participants in virtual visit: - Patient: JOMAIRA DEMIAN - CMA: Shirley Muscat CMA - Provider: Dr Althea Charon  Excessive Daytime Sleepiness Suspected OSA She reports waking up still tired, and not feeling rested from sleep. She admits some restless movements overnight and shifting or pulling covers. Often sleeps with mouth open and can snore sometimes. Admits feels often tired or sleep deprived because of children She has worked with her OBGYN  Epworth Sleepiness Scale Total Score: 12 Sitting and reading - 2 Watching TV - 3 Sitting inactive in a public place - 1 As a passenger in a car for an hour without a break - 3 Lying down to rest in the afternoon when circumstances permit - 3 Sitting and talking to someone - 0 Sitting quietly after a lunch without alcohol - 0 In a car, while stopped for a few minutes in traffic - 0  STOP-Bang OSA scoring Snoring yes   Tiredness yes   Observed apneas no   Pressure HTN no   BMI > 35 kg/m2 no   Age > 50  no   Neck  (female >17 in; Female >16 in)  no   Gender female no   OSA risk low (0-2)  OSA risk intermediate (3-4)  OSA risk high (5+)  Total: 2 low risk       10/23/2022   12:55 PM 08/31/2022   10:52 AM 06/29/2022   10:20 AM  Depression screen PHQ 2/9  Decreased Interest 0 1 0  Down, Depressed, Hopeless 0 1 0  PHQ - 2 Score 0 2 0  Altered sleeping 0 1   Tired, decreased energy 2 1   Change in appetite 0 1   Feeling bad or failure about yourself  0 0   Trouble concentrating 0 0   Moving slowly or fidgety/restless 0 0   Suicidal thoughts 0 0   PHQ-9 Score 2 5   Difficult doing work/chores Not difficult at all Somewhat difficult     Social History   Tobacco Use   Smoking status: Never   Smokeless tobacco: Never  Vaping Use   Vaping status: Never Used  Substance Use Topics   Alcohol use: Yes    Alcohol/week: 1.0 - 2.0 standard drink of alcohol    Types: 1 - 2 Standard drinks or equivalent per week    Comment: occas   Drug use: No    Review of Systems Per HPI unless specifically indicated above     Objective:    There were no vitals taken for this visit.  Wt Readings from Last 3  Encounters:  01/24/23 156 lb 15.5 oz (71.2 kg)  10/23/22 160 lb (72.6 kg)  08/31/22 161 lb (73 kg)    Physical Exam  Note examination was completely remotely via video observation objective data only  Gen - well-appearing, no acute distress or apparent pain, comfortable HEENT - eyes appear clear without discharge or redness Heart/Lungs - cannot examine virtually - observed no evidence of coughing or labored breathing. Abd - cannot examine virtually  Skin - face visible today- no rash Neuro - awake, alert, oriented Psych - not anxious appearing   Results for orders placed or performed during the hospital encounter of 01/24/23  Pregnancy, urine POC  Result Value Ref Range   Preg Test, Ur NEGATIVE NEGATIVE  Surgical pathology  Result Value Ref Range   SURGICAL PATHOLOGY      SURGICAL  PATHOLOGY CASE: [id number] PATIENT: Erin Hearing Surgical Pathology Report     Clinical History: right breast mass (cm)     FINAL MICROSCOPIC DIAGNOSIS:  A. BREAST, RIGHT CENTRAL, LUMPECTOMY: - Fibroadenoma, 1.3 cm - Biopsy site changes - Negative for carcinoma  B. BREAST, RIGHT LATERAL ANTERIOR, LUMPECTOMY: - Benign breast parenchyma with focal fibroadenomatoid change - Negative for carcinoma  C. BREAST, RIGHT LATERAL POSTERIOR, LUMPECTOMY: - Benign breast parenchyma with mild fibrocystic and focal fibroadenomatoid change - Negative for carcinoma       GROSS DESCRIPTION:  A. Specimen type: received fresh and subsequently placed in formalin labeled with the patient's name and "Right breast central tissue" is a piece of fibrofatty tissue grossly consistent with clinically stated lumpectomy. Time out of body: not recorded in OR, time in formalin: 09:45 on 01/24/23. Size: 3.8 cm medial-lateral, 2.6 cm superior-inferior, and 1.6 cm ant erior-posterior. The superior and medial surfaces are disrupted. Orientation: the specimen has been previously inked in the OR as follows: anterior - green, inferior - blue, lateral - orange, medial - yellow, posterior - black, and superior - red. Localized area: the preoperatively implanted radioactive seed is identified, removed, and sequestered according to protocol. Cut surface: a 1.3 x 0.9 x 0.7 cm white-tan, solid, firm mass surrounds a ribbon-shaped biopsy clip. Margins: superior - 0.2 cm, inferior - 1.8 cm, medial - 0.7 cm, lateral - 1.2 cm, anterior - 0.9 cm, and posterior - <0.1 cm. The mass is entirely and sequentially submitted from medial to lateral as follows: A1: mass with medial and posterior margins A2: mass with anterior, posterior, and inferior margins (ribbon clip) A3: mass with anterior, posterior, superior, and inferior margins A4: mass with posterior margin A5: lateral end, perpendicular  B. Specimen  type: received fresh and subsequently placed  in formalin labeled with the patient's name and "Right breast lateral anterior" is a piece of fibrofatty tissue grossly consistent with clinically stated lumpectomy. Time out of body: not recorded in OR, time in formalin: 09:49 on 01/24/23. Size: 3.0 cm superior-inferior, 2.3 cm medial-lateral, and 1.6 cm anterior-posterior. Orientation: the specimen has been previously inked in the OR as follows: anterior - green, inferior - blue, lateral - orange, medial - yellow, posterior - black, and superior - red. Localized area: the preoperatively implanted radioactive seed is identified, removed, and sequestered according to protocol. Cut surface: a 0.9 x 0.8 x 0.7 cm white-tan, indistinct, indurated lesion surrounds a cylinder-shaped biopsy clip. Margins: superior - 1.1 cm, inferior - 0.5 cm, medial - 0.2 cm, lateral - 0.6 cm, anterior - 0.6 cm, and posterior - 0.4 cm. The specimen is entirely and sequentially submitted from superior  to inferior as follows: B1: superior end,  perpendicular B2-B5: midportion (cylinder clip from tissue in cassette 4) B6: inferior end, perpendicular  C. Specimen type: received fresh and subsequently placed in formalin labeled with the patient's name and "Right breast lateral posterior" is a piece of fibrofatty tissue grossly consistent with clinically stated lumpectomy. Time out of body: not recorded in OR, time in formalin: 09:51 on 01/24/23. Size: 3.1 cm medial-lateral, 3.0 cm superior-inferior, and 1.5 cm anterior-posterior. Orientation: the specimen has been previously inked in the OR as follows: anterior - green, inferior - blue, lateral - orange, medial - yellow, posterior - black, and superior - red. Localized area: the preoperatively implanted radioactive seed is identified, removed, and sequestered according to protocol. Cut surface: a barbell clip is surrounded by indistinct biopsy site changes. Margins:  superior - 1.1 cm, inferior - 1.1 cm, medial - 1.5 cm, lateral - 1.0 cm, anterior - 0.4 cm, and  posterior - 0.4 cm. The specimen is entirely and sequentially submitted from medial to lateral as follows: C1: medial end, perpendicular C2-C5: midportion (barbell clip from tissue in cassette 5) C6: lateral end, perpendicular  (LEF 01/24/2023)  Final Diagnosis performed by Holley Bouche, MD.   Electronically signed 01/25/2023 Technical component performed at [facility]. [facility], [address], [place], [place] [address].  Professional component performed at [facility], [address], [place], [place] [address].  Immunohistochemistry Technical component (if applicable) was performed at [facility]. [address], [place], [place] [address].   IMMUNOHISTOCHEMISTRY DISCLAIMER (if applicable): Some of these immunohistochemical stains may have been developed and the performance characteristics determine by [facility]. Some may not have been cleared or approved by the U.S. Food and Drug Admini stration. The FDA has determined that such clearance or approval is not necessary. This test is used for clinical purposes. It should not be regarded as investigational or for research. This laboratory is certified under the Clinical Laboratory Improvement Amendments of 1988 (CLIA-88) as qualified to perform high complexity clinical laboratory testing.  The controls stained appropriately.   IHC stains are performed on formalin fixed, paraffin embedded tissue using a 3,3"diaminobenzidine (DAB) chromogen and Leica Bond Autostainer System. The staining intensity of the nucleus is score manually and is reported as the percentage of tumor cell nuclei demonstrating specific nuclear staining. The specimens are fixed in 10% Neutral Formalin for at least 6 hours and up to 72hrs. These tests are validated on decalcified tissue.  Results should be interpreted with caution given the possibility of false negative results on decalcified specimens. Antibody Clones are as follows ER-clone  17F, PR-clone 16, Ki67- clone MM1. Some of these immunohistochemical stains may have been developed and the performance characteristics determined by [facility].       Assessment & Plan:   Problem List Items Addressed This Visit   None Visit Diagnoses     Suspected sleep apnea    -  Primary   Excessive daytime sleepiness           Assessment and Plan    New problem with Persistent clinical concern for suspected obstructive sleep apnea given reported symptoms with  snoring and sleep disturbance, fatigue excessive sleepiness. - Screening: ESS score 12 / STOP-Bang Score 2 lower risk - Neck Circumference: < 16" - Co-morbidities: None  Plan: 1. Discussion on initial diagnosis and testing for OSA, risk factors, management, complications 2. Agree to proceed with home sleep study testing based  on clinical concerns -Order home sleep study through [facility] to evaluate for sleep apnea  - Also consider possibility of restless leg or other movement sleep disorder -Ensure insurance coverage for the home sleep study.   No orders of the defined types were placed in this encounter.     Follow up plan: Return if symptoms worsen or fail to improve.   Patient verbalizes understanding with the above medical recommendations including the limitation of remote medical advice.  Specific follow-up and call-back criteria were given for patient to follow-up or seek medical care more urgently if needed.  Total duration of direct patient care provided via video conference: 15 minutes   Saralyn Pilar, DO [facility] 02/07/2023, 4:31 PM

## 2023-02-10 ENCOUNTER — Other Ambulatory Visit: Payer: Self-pay | Admitting: Medical Genetics

## 2023-02-10 DIAGNOSIS — Z006 Encounter for examination for normal comparison and control in clinical research program: Secondary | ICD-10-CM

## 2023-03-05 ENCOUNTER — Encounter: Payer: Self-pay | Admitting: Family Medicine

## 2023-03-05 DIAGNOSIS — F5104 Psychophysiologic insomnia: Secondary | ICD-10-CM

## 2023-03-06 ENCOUNTER — Other Ambulatory Visit: Payer: Self-pay

## 2023-03-06 NOTE — Telephone Encounter (Signed)
Email sent to [facility] to follow up on results.

## 2023-03-07 NOTE — Telephone Encounter (Signed)
Spoke with Carollee Herter with [facility].  They are still waiting for the patients information to be resulted.

## 2023-03-09 ENCOUNTER — Other Ambulatory Visit: Payer: Self-pay

## 2023-03-13 ENCOUNTER — Other Ambulatory Visit: Payer: Self-pay | Attending: Medical Genetics

## 2023-04-05 MED ORDER — HYDROXYZINE PAMOATE 25 MG PO CAPS
25.0000 mg | ORAL_CAPSULE | Freq: Every evening | ORAL | 0 refills | Status: DC | PRN
Start: 2023-04-05 — End: 2023-11-15

## 2023-04-05 NOTE — Addendum Note (Signed)
Addended by: Smitty Cords on: 04/05/2023 07:04 PM   Modules accepted: Orders

## 2023-04-20 ENCOUNTER — Encounter: Payer: Self-pay | Admitting: Oncology

## 2023-04-27 ENCOUNTER — Telehealth: Payer: Managed Care, Other (non HMO) | Admitting: Family Medicine

## 2023-05-06 DIAGNOSIS — G4733 Obstructive sleep apnea (adult) (pediatric): Secondary | ICD-10-CM | POA: Diagnosis not present

## 2023-05-23 ENCOUNTER — Encounter: Payer: Self-pay | Admitting: Family Medicine

## 2023-05-23 ENCOUNTER — Telehealth: Payer: Self-pay | Admitting: Family Medicine

## 2023-05-23 DIAGNOSIS — G4733 Obstructive sleep apnea (adult) (pediatric): Secondary | ICD-10-CM

## 2023-05-23 NOTE — Progress Notes (Signed)
 Subjective:    Patient ID: Tammy Barton, female    DOB: Jun 16, 1985, 38 y.o.   MRN: [id number]  Tammy Barton is a 38 y.o. female presenting on 05/23/2023 for Obstructive Sleep Apnea   Virtual / Telehealth Encounter - Video Visit via MyChart The purpose of this virtual visit is to provide medical care while limiting exposure to the novel coronavirus (COVID19) for both patient and office staff.  Consent was obtained for remote visit:  Yes.   Answered questions that patient had about telehealth interaction:  Yes.   I discussed the limitations, risks, security and privacy concerns of performing an evaluation and management service by video/telephone. I also discussed with the patient that there may be a patient responsible charge related to this service. The patient expressed understanding and agreed to proceed.  Patient Location: Home Provider Location: [facility] (Office)  Participants in virtual visit: - Patient: Tammy Barton - CMA: Alan Fontana CMA - Provider: Dr Edman   HPI  Discussed the use of AI scribe software for clinical note transcription with the patient, who gave verbal consent to proceed.  History of Present Illness    Tammy Barton is a 38 year old female who presents for a CPAP compliance visit.  She began using a CPAP machine on  approximately April 13, 2023, following a [employer] home study conducted on February 14, 2023. She has adjusted to the machine and believes it is helping with her daytime fatigue. However, she occasionally removes the CPAP during sleep (unintentionally), approximately once or twice a week, without realizing it. She is using the CPAP for at least four hours a night on over seventy percent of nights, meeting compliance requirements.  She has experienced some equipment issues, but these have been resolved with the help of [employer]. She currently feels she has the right setup for her  CPAP therapy.  While she feels better than before, she still does not get enough sleep due to self-imposed schedule demands. She reports having more energy and less fatigue compared to before starting CPAP therapy.          10/23/2022   12:55 PM 08/31/2022   10:52 AM 06/29/2022   10:20 AM  Depression screen PHQ 2/9  Decreased Interest 0 1 0  Down, Depressed, Hopeless 0 1 0  PHQ - 2 Score 0 2 0  Altered sleeping 0 1   Tired, decreased energy 2 1   Change in appetite 0 1   Feeling bad or failure about yourself  0 0   Trouble concentrating 0 0   Moving slowly or fidgety/restless 0 0   Suicidal thoughts 0 0   PHQ-9 Score 2 5   Difficult doing work/chores Not difficult at all Somewhat difficult        10/23/2022   12:55 PM 08/31/2022   10:53 AM 06/21/2021    9:15 AM 03/15/2021   10:35 AM  GAD 7 : Generalized Anxiety Score  Nervous, Anxious, on Edge 1 2 2 1   Control/stop worrying 1 2 1 1   Worry too much - different things 1 2 2 1   Trouble relaxing 1 2 1 1   Restless 1 2 0 0  Easily annoyed or irritable 1 3 2 1   Afraid - awful might happen 1 3 1 1   Total GAD 7 Score 7 16 9 6   Anxiety Difficulty Somewhat difficult Somewhat difficult Not difficult at all Not difficult at all    Social History  Tobacco Use   Smoking status: Never   Smokeless tobacco: Never  Vaping Use   Vaping status: Never Used  Substance Use Topics   Alcohol use: Yes    Alcohol/week: 1.0 - 2.0 standard drink of alcohol    Types: 1 - 2 Standard drinks or equivalent per week    Comment: occas   Drug use: No    Review of Systems Per HPI unless specifically indicated above     Objective:    There were no vitals taken for this visit.  Wt Readings from Last 3 Encounters:  01/24/23 156 lb 15.5 oz (71.2 kg)  10/23/22 160 lb (72.6 kg)  08/31/22 161 lb (73 kg)     Physical Exam  Note examination was completely remotely via video observation objective data only  Gen - well-appearing, no acute distress  or apparent pain, comfortable HEENT - eyes appear clear without discharge or redness Heart/Lungs - cannot examine virtually - observed no evidence of coughing or labored breathing. Abd - cannot examine virtually  Skin - face visible today- no rash Neuro - awake, alert, oriented Psych - not anxious appearing   Results for orders placed or performed during the hospital encounter of 01/24/23  Pregnancy, urine POC   Collection Time: 01/24/23  7:29 AM  Result Value Ref Range   Preg Test, Ur NEGATIVE NEGATIVE  Surgical pathology   Collection Time: 01/24/23  8:20 AM  Result Value Ref Range   SURGICAL PATHOLOGY      SURGICAL PATHOLOGY CASE: [id number] PATIENT: Tammy Barton Surgical Pathology Report     Clinical History: right breast mass (cm)     FINAL MICROSCOPIC DIAGNOSIS:  A. BREAST, RIGHT CENTRAL, LUMPECTOMY: - Fibroadenoma, 1.3 cm - Biopsy site changes - Negative for carcinoma  B. BREAST, RIGHT LATERAL ANTERIOR, LUMPECTOMY: - Benign breast parenchyma with focal fibroadenomatoid change - Negative for carcinoma  C. BREAST, RIGHT LATERAL POSTERIOR, LUMPECTOMY: - Benign breast parenchyma with mild fibrocystic and focal fibroadenomatoid change - Negative for carcinoma       GROSS DESCRIPTION:  A. Specimen type: received fresh and subsequently placed in formalin labeled with the patient's name and Right breast central tissue is a piece of fibrofatty tissue grossly consistent with clinically stated lumpectomy. Time out of body: not recorded in OR, time in formalin: 09:45 on 01/24/23. Size: 3.8 cm medial-lateral, 2.6 cm superior-inferior, and 1.6 cm ant erior-posterior. The superior and medial surfaces are disrupted. Orientation: the specimen has been previously inked in the OR as follows: anterior - green, inferior - blue, lateral - orange, medial - yellow, posterior - black, and superior - red. Localized area: the preoperatively implanted radioactive seed  is identified, removed, and sequestered according to protocol. Cut surface: a 1.3 x 0.9 x 0.7 cm white-tan, solid, firm mass surrounds a ribbon-shaped biopsy clip. Margins: superior - 0.2 cm, inferior - 1.8 cm, medial - 0.7 cm, lateral - 1.2 cm, anterior - 0.9 cm, and posterior - <0.1 cm. The mass is entirely and sequentially submitted from medial to lateral as follows: A1: mass with medial and posterior margins A2: mass with anterior, posterior, and inferior margins (ribbon clip) A3: mass with anterior, posterior, superior, and inferior margins A4: mass with posterior margin A5: lateral end, perpendicular  B. Specimen type: received fresh and subsequently placed  in formalin labeled with the patient's name and Right breast lateral anterior is a piece of fibrofatty tissue grossly consistent with clinically stated lumpectomy. Time out of body: not recorded in OR, time  in formalin: 09:49 on 01/24/23. Size: 3.0 cm superior-inferior, 2.3 cm medial-lateral, and 1.6 cm anterior-posterior. Orientation: the specimen has been previously inked in the OR as follows: anterior - green, inferior - blue, lateral - orange, medial - yellow, posterior - black, and superior - red. Localized area: the preoperatively implanted radioactive seed is identified, removed, and sequestered according to protocol. Cut surface: a 0.9 x 0.8 x 0.7 cm white-tan, indistinct, indurated lesion surrounds a cylinder-shaped biopsy clip. Margins: superior - 1.1 cm, inferior - 0.5 cm, medial - 0.2 cm, lateral - 0.6 cm, anterior - 0.6 cm, and posterior - 0.4 cm. The specimen is entirely and sequentially submitted from superior to inferior as follows: B1: superior end,  perpendicular B2-B5: midportion (cylinder clip from tissue in cassette 4) B6: inferior end, perpendicular  C. Specimen type: received fresh and subsequently placed in formalin labeled with the patient's name and Right breast lateral posterior is a piece  of fibrofatty tissue grossly consistent with clinically stated lumpectomy. Time out of body: not recorded in OR, time in formalin: 09:51 on 01/24/23. Size: 3.1 cm medial-lateral, 3.0 cm superior-inferior, and 1.5 cm anterior-posterior. Orientation: the specimen has been previously inked in the OR as follows: anterior - green, inferior - blue, lateral - orange, medial - yellow, posterior - black, and superior - red. Localized area: the preoperatively implanted radioactive seed is identified, removed, and sequestered according to protocol. Cut surface: a barbell clip is surrounded by indistinct biopsy site changes. Margins: superior - 1.1 cm, inferior - 1.1 cm, medial - 1.5 cm, lateral - 1.0 cm, anterior - 0.4 cm, and  posterior - 0.4 cm. The specimen is entirely and sequentially submitted from medial to lateral as follows: C1: medial end, perpendicular C2-C5: midportion (barbell clip from tissue in cassette 5) C6: lateral end, perpendicular  (LEF 01/24/2023)  Final Diagnosis performed by Tammy Muskrat, MD.   Electronically signed 01/25/2023 Technical component performed at [facility]. [facility], [address] [place], [place] [address].  Professional component performed at [facility], [address], [place], [place] [address].  Immunohistochemistry Technical component (if applicable) was performed at [facility]. [address], [place], [place] [address].   IMMUNOHISTOCHEMISTRY DISCLAIMER (if applicable): Some of these immunohistochemical stains may have been developed and the performance characteristics determine by [facility]. Some may not have been cleared or approved by the U.S. Food and Drug Admini stration. The FDA has determined that such clearance or approval is not necessary. This test is used for clinical purposes. It should not be regarded as investigational or for research. This laboratory  is certified under the Clinical Laboratory Improvement Amendments of 1988 (CLIA-88) as qualified to perform high complexity clinical laboratory testing.  The controls stained appropriately.   IHC stains are performed on formalin fixed, paraffin embedded tissue using a 3,3diaminobenzidine (DAB) chromogen and Leica Bond Autostainer System. The staining intensity of the nucleus is score manually and is reported as the percentage of tumor cell nuclei demonstrating specific nuclear staining. The specimens are fixed in 10% Neutral Formalin for at least 6 hours and up to 72hrs. These tests are validated on decalcified tissue. Results should be interpreted with caution given the possibility of false negative results on decalcified specimens. Antibody Clones are as follows ER-clone  23F, PR-clone 16, Ki67- clone MM1. Some of these immunohistochemical stains may have been developed and the performance characteristics determined by [facility].       Assessment &  Plan:   Problem List Items Addressed This Visit     Obstructive sleep apnea on CPAP - Primary      Obstructive Sleep Apnea SNAP Diagnostic HST 02/14/23 [employer] medical supplier - CPAP initiated on approximately 04/13/2023. Patient reports occasional removal of CPAP during sleep, approximately once a week. However, overall adherence is good with usage at least 4 hours a night for over 70% of the nights. Patient reports some improvement in daytime fatigue. Equipment issues have been addressed with [employer].  -Continue CPAP as currently prescribed. -Encourage consistent use and adequate sleep duration. -Plan to reassess long-term benefits in future visits.  Fax copy of this note to [employer] at [phone] for insurance documentation of CPAP utilization within 30-90 days of setup.  Insurance Update Patient reports change in insurance effective 04/18/2023. No updated insurance card on file. -Patient to send photo of front and  back of new insurance card via MyChart for update in system.         No orders of the defined types were placed in this encounter.   No orders of the defined types were placed in this encounter.   Follow up plan: Return if symptoms worsen or fail to improve.   Patient verbalizes understanding with the above medical recommendations including the limitation of remote medical advice.  Specific follow-up and call-back criteria were given for patient to follow-up or seek medical care more urgently if needed.  Total duration of direct patient care provided via video conference: 6 minutes   Marsa Officer, DO [facility] 05/23/2023, 11:45 AM

## 2023-05-23 NOTE — Patient Instructions (Addendum)
 We will fax updated notes to [employer] Keep in touch if need anything else   Please schedule a Follow-up Appointment to: Return if symptoms worsen or fail to improve.  If you have any other questions or concerns, please feel free to call the office or send a message through MyChart. You may also schedule an earlier appointment if necessary.  Additionally, you may be receiving a survey about your experience at our office within a few days to 1 week by e-mail or mail. We value your feedback.  Marsa Officer, DO [facility], [place]

## 2023-05-24 ENCOUNTER — Encounter: Payer: Self-pay | Admitting: Oncology

## 2023-05-29 ENCOUNTER — Other Ambulatory Visit: Payer: Self-pay | Admitting: General Surgery

## 2023-05-29 DIAGNOSIS — Z9189 Other specified personal risk factors, not elsewhere classified: Secondary | ICD-10-CM

## 2023-06-06 DIAGNOSIS — G4733 Obstructive sleep apnea (adult) (pediatric): Secondary | ICD-10-CM | POA: Diagnosis not present

## 2023-06-11 ENCOUNTER — Encounter: Payer: Self-pay | Admitting: Family Medicine

## 2023-06-11 DIAGNOSIS — Z20828 Contact with and (suspected) exposure to other viral communicable diseases: Secondary | ICD-10-CM

## 2023-06-11 MED ORDER — OSELTAMIVIR PHOSPHATE 75 MG PO CAPS
75.0000 mg | ORAL_CAPSULE | Freq: Every day | ORAL | 0 refills | Status: DC
Start: 2023-06-11 — End: 2023-07-06

## 2023-07-02 ENCOUNTER — Encounter: Payer: 59 | Admitting: Family Medicine

## 2023-07-04 DIAGNOSIS — G4733 Obstructive sleep apnea (adult) (pediatric): Secondary | ICD-10-CM | POA: Diagnosis not present

## 2023-07-06 ENCOUNTER — Encounter: Payer: Self-pay | Admitting: Family Medicine

## 2023-07-06 ENCOUNTER — Ambulatory Visit (INDEPENDENT_AMBULATORY_CARE_PROVIDER_SITE_OTHER): Payer: 59 | Admitting: Family Medicine

## 2023-07-06 ENCOUNTER — Encounter: Payer: Self-pay | Admitting: Oncology

## 2023-07-06 VITALS — BP 122/72 | HR 75 | Ht 65.0 in | Wt 159.0 lb

## 2023-07-06 DIAGNOSIS — Z0000 Encounter for general adult medical examination without abnormal findings: Secondary | ICD-10-CM

## 2023-07-06 DIAGNOSIS — R7309 Other abnormal glucose: Secondary | ICD-10-CM

## 2023-07-06 DIAGNOSIS — F5104 Psychophysiologic insomnia: Secondary | ICD-10-CM | POA: Diagnosis not present

## 2023-07-06 DIAGNOSIS — E7800 Pure hypercholesterolemia, unspecified: Secondary | ICD-10-CM

## 2023-07-06 DIAGNOSIS — N069 Isolated proteinuria with unspecified morphologic lesion: Secondary | ICD-10-CM

## 2023-07-06 NOTE — Progress Notes (Signed)
 Subjective:    Patient ID: Tammy Barton, female    DOB: November 13, 1985, 38 y.o.   MRN: [id number]  Tammy Barton is a 38 y.o. female presenting on 07/06/2023 for Annual Exam   HPI   Discussed the use of AI scribe software for clinical note transcription with the patient, who gave verbal consent to proceed.  History of Present Illness   Tammy Barton is a 38 year old female who presents for an annual physical exam.    Admits some stressors with daughter's genetic health issues but now goal to focus on her own health as well. Goal to improve on healthy diet Goal for more low impact exercise and goal for some cardio in future Following online PT issue Goal to train to running in future   Hemochromatosis Hereditary condition Previous followed by Hematology at [facility]. She was discharged by them and has done well. She has done some phlebotomy at one point in time. Prior anemia panel done 2024 showed elevated TSat% 81 and Ferritin 41, Iron 190. Due for labs today   Hyperlipidemia Last lab 06/2022 with LDL 114 improved Goal to work on diet. Due for lab repeat today   Elevated Blood Sugar, Impaired Fasting Due for lab test. No significant concern on last labs   Anxiety History of stressors that can trigger her anxiety Currently well controlled on Sertraline 100mg  daily SSRI Following w/ therapist   History Trace Proteinuria - due for urine repeat today   OSA, on CPAP - Patient reports prior history of dx OSA and on CPAP now doing well Using DME [facility] - Today reports that sleep apnea is well controlled. She uses the CPAP machine every night. Tolerates the machine well, and thinks that sleeps better with it and feels good. No new concerns or symptoms.    Health Maintenance: Abnormal fibroadenoma breast biopsy 2015, determined benign. No repeat. fam history breast cancer grandparent age 69+ - Last  Mammogram / MR done 01/2023, followed by Dr Dwain Sarna.  Repeat Contrast enhanced Mammo 01/2024  Fam history - Mother has had recurring precancerous polyps. Age 67s, she has had several polyps 5+ in past, - Due for screening colon cancer at age 73+  Last pap smear done 08/2022 next due 3-5 years.     07/06/2023    9:19 AM 10/23/2022   12:55 PM 08/31/2022   10:52 AM  Depression screen PHQ 2/9  Decreased Interest 0 0 1  Down, Depressed, Hopeless 0 0 1  PHQ - 2 Score 0 0 2  Altered sleeping 0 0 1  Tired, decreased energy 1 2 1   Change in appetite 0 0 1  Feeling bad or failure about yourself  0 0 0  Trouble concentrating 0 0 0  Moving slowly or fidgety/restless 0 0 0  Suicidal thoughts 0 0 0  PHQ-9 Score 1 2 5   Difficult doing work/chores Not difficult at all Not difficult at all Somewhat difficult       07/06/2023    9:19 AM 10/23/2022   12:55 PM 08/31/2022   10:53 AM 06/21/2021    9:15 AM  GAD 7 : Generalized Anxiety Score  Nervous, Anxious, on Edge 1 1 2 2   Control/stop worrying 0 1 2 1   Worry too much - different things 1 1 2 2   Trouble relaxing 0 1 2 1   Restless 0 1 2 0  Easily annoyed or irritable 0 1 3 2   Afraid - awful might happen 0 1 3  1  Total GAD 7 Score 2 7 16 9   Anxiety Difficulty Not difficult at all Somewhat difficult Somewhat difficult Not difficult at all     Past Medical History:  Diagnosis Date   Anxiety    BRCA negative 08/2022   MyRiski neg except SDHD VUS   Family history of breast cancer    Fibroadenoma of both breasts    Hemochromatosis    Increased risk of breast cancer 08/2022   IBIS=42.3%   Past Surgical History:  Procedure Laterality Date   BREAST BIOPSY Right 08/17/2022   [place] Core Ribbon Clip- path pending   BREAST BIOPSY Right 08/17/2022   [place] RT BREAST BX W LOC DEV 1ST LESION IMG BX SPEC US GUIDE 08/17/2022 [facility]   BREAST BIOPSY  01/19/2023   MM RT RADIOACTIVE SEED LOC MAMMO GUIDE 01/19/2023 [facility]   BREAST BIOPSY  01/19/2023   MM RT RADIOACTIVE SEED LOC MAMMO GUIDE 01/19/2023  [facility]   BREAST BIOPSY  01/19/2023   MM RT RADIOACTIVE SEED EA ADD LESION LOC MAMMO GUIDE 01/19/2023 [facility]   BREAST SURGERY     left breast mass, biopsy, benign      RADIOACTIVE SEED GUIDED EXCISIONAL BREAST BIOPSY Right 01/24/2023   Procedure: RIGHT BREAST SEED GUIDED EXCISIONAL BIOPSY x3;  Surgeon: Emelia Loron, MD;  Location: [facility];  Service: General;  Laterality: Right;  LMA   Social History   Socioeconomic History   Marital status: Married    Spouse name: Ree Kida   Number of children: Not on file   Years of education: Not on file   Highest education level: Not on file  Occupational History   Not on file  Tobacco Use   Smoking status: Never   Smokeless tobacco: Never  Vaping Use   Vaping status: Never Used  Substance and Sexual Activity   Alcohol use: Yes    Alcohol/week: 1.0 - 2.0 standard drink of alcohol    Types: 1 - 2 Standard drinks or equivalent per week    Comment: occas   Drug use: No   Sexual activity: Yes    Birth control/protection: None  Other Topics Concern   Not on file  Social History Narrative   Not on file   Social Drivers of Health   Financial Resource Strain: Not on file  Food Insecurity: Not on file  Transportation Needs: Not on file  Physical Activity: Not on file  Stress: Not on file  Social Connections: Not on file  Intimate Partner Violence: Not on file   Family History  Problem Relation Age of Onset   Colon polyps Mother    Diabetes Paternal Grandmother    Breast cancer Paternal Grandmother        46s   Breast cancer Cousin        early 13s, triple negative type; gene neg   Breast cancer Other        passed in her 12s with breast cancer   Prostate cancer Maternal Uncle        doing treatment   Prostate cancer Maternal Uncle    Current Outpatient Medications on File Prior to Visit  Medication Sig   Multiple Vitamin (MULTIVITAMIN) capsule Take 1 capsule by mouth daily.   sertraline  (ZOLOFT) 100 MG tablet Take 1 tablet (100 mg total) by mouth daily.   Vitamin D, Cholecalciferol, 10 MCG (400 UNIT) TABS Take by mouth.   hydrOXYzine (VISTARIL) 25 MG capsule Take 1 capsule (25 mg  total) by mouth at bedtime as needed for anxiety (insomnia). (Patient not taking: Reported on 07/06/2023)   oseltamivir (TAMIFLU) 75 MG capsule Take 1 capsule (75 mg total) by mouth daily. For 10 days. If develop symptoms change dosing to twice daily to finish course (Patient not taking: Reported on 07/06/2023)   No current facility-administered medications on file prior to visit.    Review of Systems  Constitutional:  Negative for activity change, appetite change, chills, diaphoresis, fatigue and fever.  HENT:  Negative for congestion and hearing loss.   Eyes:  Negative for visual disturbance.  Respiratory:  Negative for cough, chest tightness, shortness of breath and wheezing.   Cardiovascular:  Negative for chest pain, palpitations and leg swelling.  Gastrointestinal:  Negative for abdominal pain, constipation, diarrhea, nausea and vomiting.  Genitourinary:  Negative for dysuria, frequency and hematuria.  Musculoskeletal:  Negative for arthralgias and neck pain.  Skin:  Negative for rash.  Neurological:  Negative for dizziness, weakness, light-headedness, numbness and headaches.  Hematological:  Negative for adenopathy.  Psychiatric/Behavioral:  Negative for behavioral problems, dysphoric mood and sleep disturbance.    Per HPI unless specifically indicated above     Objective:    BP 122/72 (BP Location: Left Arm, Patient Position: Sitting)   Pulse 75   Ht 5\' 5"  (1.651 m)   Wt 159 lb (72.1 kg)   SpO2 99%   BMI 26.46 kg/m   Wt Readings from Last 3 Encounters:  07/06/23 159 lb (72.1 kg)  01/24/23 156 lb 15.5 oz (71.2 kg)  10/23/22 160 lb (72.6 kg)    Physical Exam Vitals and nursing note reviewed.  Constitutional:      General: She is not in acute distress.    Appearance: She is  well-developed. She is not diaphoretic.     Comments: Well-appearing, comfortable, cooperative  HENT:     Head: Normocephalic and atraumatic.     Right Ear: Tympanic membrane, ear canal and external ear normal. There is no impacted cerumen.     Left Ear: Tympanic membrane, ear canal and external ear normal. There is no impacted cerumen.  Eyes:     General:        Right eye: No discharge.        Left eye: No discharge.     Conjunctiva/sclera: Conjunctivae normal.     Pupils: Pupils are equal, round, and reactive to light.  Neck:     Thyroid: No thyromegaly.     Vascular: No carotid bruit.  Cardiovascular:     Rate and Rhythm: Normal rate and regular rhythm.     Pulses: Normal pulses.     Heart sounds: Normal heart sounds. No murmur heard. Pulmonary:     Effort: Pulmonary effort is normal. No respiratory distress.     Breath sounds: Normal breath sounds. No wheezing or rales.  Abdominal:     General: Bowel sounds are normal. There is no distension.     Palpations: Abdomen is soft. There is no mass.     Tenderness: There is no abdominal tenderness.  Musculoskeletal:        General: No tenderness. Normal range of motion.     Cervical back: Normal range of motion and neck supple.     Right lower leg: No edema.     Left lower leg: No edema.     Comments: Upper / Lower Extremities: - Normal muscle tone, strength bilateral upper extremities 5/5, lower extremities 5/5  Lymphadenopathy:     Cervical:  No cervical adenopathy.  Skin:    General: Skin is warm and dry.     Findings: No erythema or rash.  Neurological:     Mental Status: She is alert and oriented to person, place, and time.     Comments: Distal sensation intact to light touch all extremities  Psychiatric:        Mood and Affect: Mood normal.        Behavior: Behavior normal.        Thought Content: Thought content normal.     Comments: Well groomed, good eye contact, normal speech and thoughts     Results for orders  placed or performed during the hospital encounter of 01/24/23  Pregnancy, urine POC   Collection Time: 01/24/23  7:29 AM  Result Value Ref Range   Preg Test, Ur NEGATIVE NEGATIVE  Surgical pathology   Collection Time: 01/24/23  8:20 AM  Result Value Ref Range   SURGICAL PATHOLOGY      SURGICAL PATHOLOGY CASE: [id number] PATIENT: Erin Hearing Surgical Pathology Report     Clinical History: right breast mass (cm)     FINAL MICROSCOPIC DIAGNOSIS:  A. BREAST, RIGHT CENTRAL, LUMPECTOMY: - Fibroadenoma, 1.3 cm - Biopsy site changes - Negative for carcinoma  B. BREAST, RIGHT LATERAL ANTERIOR, LUMPECTOMY: - Benign breast parenchyma with focal fibroadenomatoid change - Negative for carcinoma  C. BREAST, RIGHT LATERAL POSTERIOR, LUMPECTOMY: - Benign breast parenchyma with mild fibrocystic and focal fibroadenomatoid change - Negative for carcinoma       GROSS DESCRIPTION:  A. Specimen type: received fresh and subsequently placed in formalin labeled with the patient's name and "Right breast central tissue" is a piece of fibrofatty tissue grossly consistent with clinically stated lumpectomy. Time out of body: not recorded in OR, time in formalin: 09:45 on 01/24/23. Size: 3.8 cm medial-lateral, 2.6 cm superior-inferior, and 1.6 cm ant erior-posterior. The superior and medial surfaces are disrupted. Orientation: the specimen has been previously inked in the OR as follows: anterior - green, inferior - blue, lateral - orange, medial - yellow, posterior - black, and superior - red. Localized area: the preoperatively implanted radioactive seed is identified, removed, and sequestered according to protocol. Cut surface: a 1.3 x 0.9 x 0.7 cm white-tan, solid, firm mass surrounds a ribbon-shaped biopsy clip. Margins: superior - 0.2 cm, inferior - 1.8 cm, medial - 0.7 cm, lateral - 1.2 cm, anterior - 0.9 cm, and posterior - <0.1 cm. The mass is entirely and sequentially  submitted from medial to lateral as follows: A1: mass with medial and posterior margins A2: mass with anterior, posterior, and inferior margins (ribbon clip) A3: mass with anterior, posterior, superior, and inferior margins A4: mass with posterior margin A5: lateral end, perpendicular  B. Specimen type: received fresh and subsequently placed  in formalin labeled with the patient's name and "Right breast lateral anterior" is a piece of fibrofatty tissue grossly consistent with clinically stated lumpectomy. Time out of body: not recorded in OR, time in formalin: 09:49 on 01/24/23. Size: 3.0 cm superior-inferior, 2.3 cm medial-lateral, and 1.6 cm anterior-posterior. Orientation: the specimen has been previously inked in the OR as follows: anterior - green, inferior - blue, lateral - orange, medial - yellow, posterior - black, and superior - red. Localized area: the preoperatively implanted radioactive seed is identified, removed, and sequestered according to protocol. Cut surface: a 0.9 x 0.8 x 0.7 cm white-tan, indistinct, indurated lesion surrounds a cylinder-shaped biopsy clip. Margins: superior - 1.1 cm, inferior - 0.5  cm, medial - 0.2 cm, lateral - 0.6 cm, anterior - 0.6 cm, and posterior - 0.4 cm. The specimen is entirely and sequentially submitted from superior to inferior as follows: B1: superior end,  perpendicular B2-B5: midportion (cylinder clip from tissue in cassette 4) B6: inferior end, perpendicular  C. Specimen type: received fresh and subsequently placed in formalin labeled with the patient's name and "Right breast lateral posterior" is a piece of fibrofatty tissue grossly consistent with clinically stated lumpectomy. Time out of body: not recorded in OR, time in formalin: 09:51 on 01/24/23. Size: 3.1 cm medial-lateral, 3.0 cm superior-inferior, and 1.5 cm anterior-posterior. Orientation: the specimen has been previously inked in the OR as follows: anterior - green,  inferior - blue, lateral - orange, medial - yellow, posterior - black, and superior - red. Localized area: the preoperatively implanted radioactive seed is identified, removed, and sequestered according to protocol. Cut surface: a barbell clip is surrounded by indistinct biopsy site changes. Margins: superior - 1.1 cm, inferior - 1.1 cm, medial - 1.5 cm, lateral - 1.0 cm, anterior - 0.4 cm, and  posterior - 0.4 cm. The specimen is entirely and sequentially submitted from medial to lateral as follows: C1: medial end, perpendicular C2-C5: midportion (barbell clip from tissue in cassette 5) C6: lateral end, perpendicular  (LEF 01/24/2023)  Final Diagnosis performed by Holley Bouche, MD.   Electronically signed 01/25/2023 Technical component performed at [facility]. [facility], [address], [place], [place] [address].  Professional component performed at [facility], [address], [place], [place] [address].  Immunohistochemistry Technical component (if applicable) was performed at [facility]. [address], [address], [place], [place] [address].   IMMUNOHISTOCHEMISTRY DISCLAIMER (if applicable): Some of these immunohistochemical stains may have been developed and the performance characteristics determine by [facility]. Some may not have been cleared or approved by the U.S. Food and Drug Admini stration. The FDA has determined that such clearance or approval is not necessary. This test is used for clinical purposes. It should not be regarded as investigational or for research. This laboratory is certified under the Clinical Laboratory Improvement Amendments of 1988 (CLIA-88) as qualified to perform high complexity clinical laboratory testing.  The controls stained appropriately.   IHC stains are performed on formalin fixed, paraffin embedded tissue using a 3,3"diaminobenzidine (DAB) chromogen and Leica Bond Autostainer  System. The staining intensity of the nucleus is score manually and is reported as the percentage of tumor cell nuclei demonstrating specific nuclear staining. The specimens are fixed in 10% Neutral Formalin for at least 6 hours and up to 72hrs. These tests are validated on decalcified tissue. Results should be interpreted with caution given the possibility of false negative results on decalcified specimens. Antibody Clones are as follows ER-clone  6F, PR-clone 16, Ki67- clone MM1. Some of these immunohistochemical stains may have been developed and the performance characteristics determined by [facility].       Assessment & Plan:   Problem List Items Addressed This Visit     Hereditary hemochromatosis (HCC)   Relevant Orders   CBC with Differential/Platelet   Iron, TIBC and Ferritin Panel   Other Visit Diagnoses       Annual physical exam    -  Primary   Relevant Orders   TSH   Lipid panel   Hemoglobin A1c   COMPLETE METABOLIC PANEL WITH GFR   CBC with Differential/Platelet   Iron, TIBC and Ferritin Panel  Urinalysis, Routine w reflex microscopic     Psychophysiological insomnia       Relevant Orders   COMPLETE METABOLIC PANEL WITH GFR     Pure hypercholesterolemia       Relevant Orders   TSH   Lipid panel   COMPLETE METABOLIC PANEL WITH GFR     Abnormal glucose       Relevant Orders   Hemoglobin A1c     Isolated proteinuria with morphologic lesion       Relevant Orders   Urinalysis, Routine w reflex microscopic        Updated Health Maintenance information Fasting labs ordered for today, pending results. Encouraged improvement to lifestyle with diet and exercise Goal of weight loss  Obstructive Sleep Apnea OSA on CPAP She effectively uses CPAP with improved sleep quality. Using CPAP every night >4 hours. Tolerating well. She benefits from therapy and is able to sleep better. Documentation issue with [facility] addressed. - Re-fax CPAP documentation  to [facility], last visit note from 05/23/23 it was faxed, but they have not received it, we will re-send it today  Hereditary Hemochromatosis Fam history and Patient known diagnosis history Regular monitoring of iron levels necessary to prevent complications. - Order iron panel including ferritin. - Order CBC and TSH screening.  General Health Maintenance Cervical cancer screening up to date. Breast cancer screening planned.  - Order comprehensive blood panel including cholesterol, glucose, chemistry, kidney, and liver tests. - Schedule annual physical for July 18, 2024.         Orders Placed This Encounter  Procedures   TSH   Lipid panel    Has the patient fasted?:   Yes   Hemoglobin A1c   COMPLETE METABOLIC PANEL WITH GFR   CBC with Differential/Platelet   Iron, TIBC and Ferritin Panel   Urinalysis, Routine w reflex microscopic    No orders of the defined types were placed in this encounter.    Follow up plan: Return for 1 year Annual Physical - AM fasting lab after.  Saralyn Pilar, DO [facility] 07/06/2023, 9:25 AM

## 2023-07-06 NOTE — Patient Instructions (Addendum)
 Thank you for coming to the office today.  Labs today   Stay tuned on results  We did fax a copy of the note with the 30-90 day confirmation with the CPAP to [facility] back on 05/23/23  Again re-faxed today.  We sent it to this Fax - [phone]    Please schedule a Follow-up Appointment to: Return for 1 year Annual Physical - AM fasting lab after.  If you have any other questions or concerns, please feel free to call the office or send a message through MyChart. You may also schedule an earlier appointment if necessary.  Additionally, you may be receiving a survey about your experience at our office within a few days to 1 week by e-mail or mail. We value your feedback.  Saralyn Pilar, DO [facility], [place]

## 2023-07-07 LAB — COMPLETE METABOLIC PANEL WITH GFR
AG Ratio: 1.8 (calc) (ref 1.0–2.5)
ALT: 35 U/L — ABNORMAL HIGH (ref 6–29)
AST: 26 U/L (ref 10–30)
Albumin: 4.8 g/dL (ref 3.6–5.1)
Alkaline phosphatase (APISO): 69 U/L (ref 31–125)
BUN: 18 mg/dL (ref 7–25)
CO2: 31 mmol/L (ref 20–32)
Calcium: 9.6 mg/dL (ref 8.6–10.2)
Chloride: 102 mmol/L (ref 98–110)
Creat: 0.75 mg/dL (ref 0.50–0.97)
Globulin: 2.7 g/dL (ref 1.9–3.7)
Glucose, Bld: 87 mg/dL (ref 65–99)
Potassium: 4.5 mmol/L (ref 3.5–5.3)
Sodium: 140 mmol/L (ref 135–146)
Total Bilirubin: 0.7 mg/dL (ref 0.2–1.2)
Total Protein: 7.5 g/dL (ref 6.1–8.1)

## 2023-07-07 LAB — URINALYSIS, ROUTINE W REFLEX MICROSCOPIC
Bilirubin Urine: NEGATIVE
Glucose, UA: NEGATIVE
Hgb urine dipstick: NEGATIVE
Ketones, ur: NEGATIVE
Leukocytes,Ua: NEGATIVE
Nitrite: NEGATIVE
Protein, ur: NEGATIVE
Specific Gravity, Urine: 1.029 (ref 1.001–1.035)
pH: 6 (ref 5.0–8.0)

## 2023-07-07 LAB — CBC WITH DIFFERENTIAL/PLATELET
Absolute Lymphocytes: 2040 {cells}/uL (ref 850–3900)
Absolute Monocytes: 350 {cells}/uL (ref 200–950)
Basophils Absolute: 72 {cells}/uL (ref 0–200)
Basophils Relative: 1.5 %
Eosinophils Absolute: 149 {cells}/uL (ref 15–500)
Eosinophils Relative: 3.1 %
HCT: 45.1 % — ABNORMAL HIGH (ref 35.0–45.0)
Hemoglobin: 15.1 g/dL (ref 11.7–15.5)
MCH: 32.8 pg (ref 27.0–33.0)
MCHC: 33.5 g/dL (ref 32.0–36.0)
MCV: 98 fL (ref 80.0–100.0)
MPV: 10.5 fL (ref 7.5–12.5)
Monocytes Relative: 7.3 %
Neutro Abs: 2189 {cells}/uL (ref 1500–7800)
Neutrophils Relative %: 45.6 %
Platelets: 285 10*3/uL (ref 140–400)
RBC: 4.6 10*6/uL (ref 3.80–5.10)
RDW: 11.6 % (ref 11.0–15.0)
Total Lymphocyte: 42.5 %
WBC: 4.8 10*3/uL (ref 3.8–10.8)

## 2023-07-07 LAB — HEMOGLOBIN A1C
Hgb A1c MFr Bld: 5 %{Hb} (ref <5.7)
Mean Plasma Glucose: 97 mg/dL
eAG (mmol/L): 5.4 mmol/L

## 2023-07-07 LAB — IRON,TIBC AND FERRITIN PANEL
%SAT: 96 % — ABNORMAL HIGH (ref 16–45)
Ferritin: 72 ng/mL (ref 16–154)
Iron: 243 ug/dL — ABNORMAL HIGH (ref 40–190)
TIBC: 253 ug/dL (ref 250–450)

## 2023-07-07 LAB — LIPID PANEL
Cholesterol: 243 mg/dL — ABNORMAL HIGH (ref <200)
HDL: 51 mg/dL (ref >=50)
LDL Cholesterol (Calc): 161 mg/dL — ABNORMAL HIGH
Non-HDL Cholesterol (Calc): 192 mg/dL — ABNORMAL HIGH (ref <130)
Total CHOL/HDL Ratio: 4.8 (calc) (ref <5.0)
Triglycerides: 160 mg/dL — ABNORMAL HIGH (ref <150)

## 2023-07-07 LAB — TSH: TSH: 1.32 m[IU]/L

## 2023-07-11 ENCOUNTER — Encounter: Payer: Self-pay | Admitting: Family Medicine

## 2023-07-12 NOTE — Addendum Note (Signed)
 Addended by: Smitty Cords on: 07/12/2023 02:54 PM   Modules accepted: Orders

## 2023-07-30 ENCOUNTER — Encounter: Payer: Self-pay | Admitting: Oncology

## 2023-07-30 ENCOUNTER — Inpatient Hospital Stay

## 2023-07-30 ENCOUNTER — Inpatient Hospital Stay: Attending: Oncology | Admitting: Oncology

## 2023-07-30 DIAGNOSIS — Z8042 Family history of malignant neoplasm of prostate: Secondary | ICD-10-CM | POA: Diagnosis not present

## 2023-07-30 DIAGNOSIS — R7401 Elevation of levels of liver transaminase levels: Secondary | ICD-10-CM | POA: Insufficient documentation

## 2023-07-30 DIAGNOSIS — Z803 Family history of malignant neoplasm of breast: Secondary | ICD-10-CM | POA: Insufficient documentation

## 2023-07-30 DIAGNOSIS — Z833 Family history of diabetes mellitus: Secondary | ICD-10-CM | POA: Diagnosis not present

## 2023-07-30 DIAGNOSIS — Z83719 Family history of colon polyps, unspecified: Secondary | ICD-10-CM | POA: Insufficient documentation

## 2023-07-30 DIAGNOSIS — Z79899 Other long term (current) drug therapy: Secondary | ICD-10-CM | POA: Insufficient documentation

## 2023-07-30 DIAGNOSIS — Z86018 Personal history of other benign neoplasm: Secondary | ICD-10-CM | POA: Diagnosis not present

## 2023-07-30 NOTE — Progress Notes (Signed)
 Patient tolerated phlebotomy well used 20 gauge IV. Removed as ordered. No concerns and stable at discharge. AVS reviewed and pt refused a copy.

## 2023-07-30 NOTE — Progress Notes (Signed)
  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   INTERVAL HISTORY: Patient last evaluated in clinic in February 2020.  She is referred back for increasing iron saturation level of 96%.  She currently feels well and is asymptomatic.  She has no neurologic complaints.  She denies any recent fevers or illnesses.  She has a good appetite and denies weight loss.  She has no chest pain, shortness of breath, cough, or hemoptysis.  She denies any nausea, vomiting, constipation, or diarrhea.  She has no urinary complaints.  Patient feels at her baseline and offers no specific complaints today.  REVIEW OF SYSTEMS:   Review of Systems  Constitutional: Negative.  Negative for fever, malaise/fatigue and weight loss.  Respiratory: Negative.  Negative for cough, hemoptysis and shortness of breath.   Cardiovascular: Negative.  Negative for chest pain and leg swelling.  Gastrointestinal: Negative.  Negative for abdominal pain.  Genitourinary: Negative.  Negative for dysuria.  Musculoskeletal: Negative.  Negative for back pain.  Skin: Negative.  Negative for rash.  Neurological: Negative.  Negative for dizziness, focal weakness, weakness and headaches.  Psychiatric/Behavioral: Negative.  The patient is not nervous/anxious.     As per HPI. Otherwise, a complete review of systems is negative.  PAST MEDICAL HISTORY: Past Medical History:  Diagnosis Date   Anxiety    BRCA negative 08/2022   MyRiski neg except SDHD VUS   Family history of breast cancer    Fibroadenoma of both breasts    Hemochromatosis     Increased risk of breast cancer 08/2022   IBIS=42.3%    PAST SURGICAL HISTORY: Past Surgical History:  Procedure Laterality Date   BREAST BIOPSY Right 08/17/2022   [place] Core Ribbon Clip- path pending   BREAST BIOPSY Right 08/17/2022   [place] RT BREAST BX W LOC DEV 1ST LESION IMG BX SPEC US GUIDE 08/17/2022 [facility]   BREAST BIOPSY  01/19/2023   MM RT RADIOACTIVE SEED LOC MAMMO GUIDE 01/19/2023 [facility]   BREAST BIOPSY  01/19/2023   MM RT RADIOACTIVE SEED LOC MAMMO GUIDE 01/19/2023 [facility]   BREAST BIOPSY  01/19/2023   MM RT RADIOACTIVE SEED EA ADD LESION LOC MAMMO GUIDE 01/19/2023 [facility]   BREAST SURGERY     left breast mass, biopsy, benign      RADIOACTIVE SEED GUIDED EXCISIONAL BREAST BIOPSY Right 01/24/2023   Procedure: RIGHT BREAST SEED GUIDED EXCISIONAL BIOPSY x3;  Surgeon: Emelia Loron, MD;  Location: [facility];  Service: General;  Laterality: Right;  LMA    FAMILY HISTORY: Family History  Problem Relation Age of Onset   Colon polyps Mother    Diabetes Paternal Grandmother    Breast cancer Paternal Grandmother        50s   Breast cancer Cousin        early 57s, triple negative type; gene neg   Breast cancer Other        passed in her 22s with breast cancer   Prostate cancer Maternal Uncle        doing treatment   Prostate cancer Maternal Uncle     ADVANCED DIRECTIVES (Y/N):  N  HEALTH MAINTENANCE: Social History  Tobacco Use   Smoking status: Never   Smokeless tobacco: Never  Vaping Use   Vaping status: Never Used  Substance Use Topics   Alcohol use: Yes    Alcohol/week: 1.0 - 2.0 standard drink of alcohol    Types: 1 - 2 Standard drinks or equivalent per week    Comment: occas   Drug use: No     Colonoscopy:  PAP:  Bone density:  Lipid panel:  No Known Allergies  Current Outpatient Medications  Medication Sig Dispense Refill   Multiple Vitamin (MULTIVITAMIN) capsule Take 1 capsule by mouth daily.      sertraline (ZOLOFT) 100 MG tablet Take 1 tablet (100 mg total) by mouth daily. 90 tablet 2   Vitamin D, Cholecalciferol, 10 MCG (400 UNIT) TABS Take by mouth.     hydrOXYzine (VISTARIL) 25 MG capsule Take 1 capsule (25 mg total) by mouth at bedtime as needed for anxiety (insomnia). (Patient not taking: Reported on 07/30/2023) 30 capsule 0   No current facility-administered medications for this visit.    OBJECTIVE: Vitals:   07/30/23 1341  BP: 111/65  Pulse: 70  Resp: 19  Temp: 98.6 F (37 C)  SpO2: 99%     Body mass index is 27.72 kg/m.    ECOG FS:0 - Asymptomatic  General: Well-developed, well-nourished, no acute distress. Eyes: Pink conjunctiva, anicteric sclera. HEENT: Normocephalic, moist mucous membranes. Lungs: No audible wheezing or coughing. Heart: Regular rate and rhythm. Abdomen: Soft, nontender, no obvious distention. Musculoskeletal: No edema, cyanosis, or clubbing. Neuro: Alert, answering all questions appropriately. Cranial nerves grossly intact. Skin: No rashes or petechiae noted. Psych: Normal affect. Lymphatics: No cervical, calvicular, axillary or inguinal LAD.   LAB RESULTS:  Lab Results  Component Value Date   NA 140 07/06/2023   K 4.5 07/06/2023   CL 102 07/06/2023   CO2 31 07/06/2023   GLUCOSE 87 07/06/2023   BUN 18 07/06/2023   CREATININE 0.75 07/06/2023   CALCIUM 9.6 07/06/2023   PROT 7.5 07/06/2023   ALBUMIN 3.4 (L) 08/01/2019   AST 26 07/06/2023   ALT 35 (H) 07/06/2023   ALKPHOS 92 08/01/2019   BILITOT 0.7 07/06/2023   GFRNONAA 128 08/01/2019   GFRAA 147 08/01/2019    Lab Results  Component Value Date   WBC 4.8 07/06/2023   NEUTROABS 2,189 07/06/2023   HGB 15.1 07/06/2023   HCT 45.1 (H) 07/06/2023   MCV 98.0 07/06/2023   PLT 285 07/06/2023   Lab Results  Component Value Date   IRON 243 (H) 07/06/2023   TIBC 253 07/06/2023   IRONPCTSAT 96 (H) 07/06/2023   Lab Results  Component Value Date   FERRITIN 72 07/06/2023      STUDIES: No results found.  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   PLAN:    Hereditary hemochromatosis: Patient's hemoglobin continues to be within normal limits and her ferritin remains below 100.  Iron saturation level has increased significantly and is now 96% with a total iron of 243.  While goal iron saturation will be 50%, this may be difficult given her reduced ferritin and normal hemoglobin.  Proceed with 500 mL phlebotomy today.  Return to clinic in 4 weeks for further evaluation and continuation of treatment if needed. Transaminitis: Patient has a mildly elevated ALT.  Will repeat liver panel along with AFP at next clinic visit.  I spent a total of 45 minutes reviewing chart data, face-to-face evaluation with the patient, counseling and coordination of care  as detailed above.   Patient expressed understanding and was in agreement with this plan. She also understands that She can call clinic at any time with any questions, concerns, or complaints.    Shellie Dials, MD   07/30/2023 3:26 PM

## 2023-07-30 NOTE — Patient Instructions (Signed)

## 2023-08-04 DIAGNOSIS — G4733 Obstructive sleep apnea (adult) (pediatric): Secondary | ICD-10-CM | POA: Diagnosis not present

## 2023-08-06 ENCOUNTER — Ambulatory Visit
Admission: RE | Admit: 2023-08-06 | Discharge: 2023-08-06 | Disposition: A | Discharge status: Home / Self Care | Source: Ambulatory Visit | Attending: General Surgery | Admitting: General Surgery

## 2023-08-06 DIAGNOSIS — Z9189 Other specified personal risk factors, not elsewhere classified: Secondary | ICD-10-CM

## 2023-08-06 DIAGNOSIS — Z1239 Encounter for other screening for malignant neoplasm of breast: Secondary | ICD-10-CM | POA: Diagnosis not present

## 2023-08-06 MED ORDER — IOPAMIDOL (ISOVUE-370) INJECTION 76%
100.0000 mL | Freq: Once | INTRAVENOUS | Status: AC | PRN
  Administered 2023-08-06: 100 mL via INTRAVENOUS

## 2023-08-13 DIAGNOSIS — L858 Other specified epidermal thickening: Secondary | ICD-10-CM | POA: Diagnosis not present

## 2023-08-13 DIAGNOSIS — L814 Other melanin hyperpigmentation: Secondary | ICD-10-CM | POA: Diagnosis not present

## 2023-08-13 DIAGNOSIS — D2339 Other benign neoplasm of skin of other parts of face: Secondary | ICD-10-CM | POA: Diagnosis not present

## 2023-08-13 DIAGNOSIS — Z1283 Encounter for screening for malignant neoplasm of skin: Secondary | ICD-10-CM | POA: Diagnosis not present

## 2023-08-27 ENCOUNTER — Inpatient Hospital Stay

## 2023-08-27 ENCOUNTER — Inpatient Hospital Stay: Attending: Oncology

## 2023-08-27 ENCOUNTER — Other Ambulatory Visit

## 2023-08-27 DIAGNOSIS — Z83719 Family history of colon polyps, unspecified: Secondary | ICD-10-CM | POA: Insufficient documentation

## 2023-08-27 DIAGNOSIS — Z79899 Other long term (current) drug therapy: Secondary | ICD-10-CM | POA: Diagnosis not present

## 2023-08-27 DIAGNOSIS — Z833 Family history of diabetes mellitus: Secondary | ICD-10-CM | POA: Insufficient documentation

## 2023-08-27 DIAGNOSIS — Z803 Family history of malignant neoplasm of breast: Secondary | ICD-10-CM | POA: Diagnosis not present

## 2023-08-27 DIAGNOSIS — Z8042 Family history of malignant neoplasm of prostate: Secondary | ICD-10-CM | POA: Diagnosis not present

## 2023-08-27 DIAGNOSIS — R9230 Dense breasts, unspecified: Secondary | ICD-10-CM | POA: Insufficient documentation

## 2023-08-27 DIAGNOSIS — Z86018 Personal history of other benign neoplasm: Secondary | ICD-10-CM | POA: Insufficient documentation

## 2023-08-27 LAB — COMPREHENSIVE METABOLIC PANEL WITH GFR
ALT: 25 U/L (ref 0–44)
AST: 27 U/L (ref 15–41)
Albumin: 4.6 g/dL (ref 3.5–5.0)
Alkaline Phosphatase: 59 U/L (ref 38–126)
Anion gap: 9 (ref 5–15)
BUN: 13 mg/dL (ref 6–20)
CO2: 27 mmol/L (ref 22–32)
Calcium: 9.3 mg/dL (ref 8.9–10.3)
Chloride: 105 mmol/L (ref 98–111)
Creatinine, Ser: 0.83 mg/dL (ref 0.44–1.00)
GFR, Estimated: >60 mL/min (ref >60)
Glucose, Bld: 102 mg/dL — ABNORMAL HIGH (ref 70–99)
Potassium: 3.9 mmol/L (ref 3.5–5.1)
Sodium: 141 mmol/L (ref 135–145)
Total Bilirubin: 1 mg/dL (ref 0.0–1.2)
Total Protein: 7.6 g/dL (ref 6.5–8.1)

## 2023-08-27 LAB — CBC WITH DIFFERENTIAL/PLATELET
Abs Immature Granulocytes: 0.01 10*3/uL (ref 0.00–0.07)
Basophils Absolute: 0.1 10*3/uL (ref 0.0–0.1)
Basophils Relative: 1 %
Eosinophils Absolute: 0.2 10*3/uL (ref 0.0–0.5)
Eosinophils Relative: 4 %
HCT: 42.4 % (ref 36.0–46.0)
Hemoglobin: 14.5 g/dL (ref 12.0–15.0)
Immature Granulocytes: 0 %
Lymphocytes Relative: 36 %
Lymphs Abs: 1.8 10*3/uL (ref 0.7–4.0)
MCH: 33.3 pg (ref 26.0–34.0)
MCHC: 34.2 g/dL (ref 30.0–36.0)
MCV: 97.2 fL (ref 80.0–100.0)
Monocytes Absolute: 0.4 10*3/uL (ref 0.1–1.0)
Monocytes Relative: 8 %
Neutro Abs: 2.5 10*3/uL (ref 1.7–7.7)
Neutrophils Relative %: 51 %
Platelets: 227 10*3/uL (ref 150–400)
RBC: 4.36 MIL/uL (ref 3.87–5.11)
RDW: 11.9 % (ref 11.5–15.5)
WBC: 4.9 10*3/uL (ref 4.0–10.5)
nRBC: 0 % (ref 0.0–0.2)

## 2023-08-27 LAB — IRON AND TIBC
Iron: 210 ug/dL — ABNORMAL HIGH (ref 28–170)
Saturation Ratios: 79 % — ABNORMAL HIGH (ref 10.4–31.8)
TIBC: 267 ug/dL (ref 250–450)
UIBC: 57 ug/dL

## 2023-08-27 LAB — FERRITIN: Ferritin: 38 ng/mL (ref 11–307)

## 2023-08-28 LAB — AFP TUMOR MARKER: AFP, Serum, Tumor Marker: 4.5 ng/mL (ref 0.0–6.4)

## 2023-08-29 ENCOUNTER — Inpatient Hospital Stay

## 2023-08-29 ENCOUNTER — Encounter: Payer: Self-pay | Admitting: Oncology

## 2023-08-29 ENCOUNTER — Inpatient Hospital Stay (HOSPITAL_BASED_OUTPATIENT_CLINIC_OR_DEPARTMENT_OTHER): Admitting: Oncology

## 2023-08-29 DIAGNOSIS — Z803 Family history of malignant neoplasm of breast: Secondary | ICD-10-CM | POA: Diagnosis not present

## 2023-08-29 DIAGNOSIS — Z833 Family history of diabetes mellitus: Secondary | ICD-10-CM | POA: Diagnosis not present

## 2023-08-29 DIAGNOSIS — Z86018 Personal history of other benign neoplasm: Secondary | ICD-10-CM | POA: Diagnosis not present

## 2023-08-29 DIAGNOSIS — R9230 Dense breasts, unspecified: Secondary | ICD-10-CM | POA: Diagnosis not present

## 2023-08-29 DIAGNOSIS — Z83719 Family history of colon polyps, unspecified: Secondary | ICD-10-CM | POA: Diagnosis not present

## 2023-08-29 DIAGNOSIS — Z8042 Family history of malignant neoplasm of prostate: Secondary | ICD-10-CM | POA: Diagnosis not present

## 2023-08-29 DIAGNOSIS — Z79899 Other long term (current) drug therapy: Secondary | ICD-10-CM | POA: Diagnosis not present

## 2023-08-29 NOTE — Progress Notes (Signed)
 Berle Breeding presents today for phlebotomy per MD orders. Phlebotomy procedure started at 1450 and ended at 1504 250 mls removed. Patient tolerated procedure well. IV needle removed intact.

## 2023-08-29 NOTE — Patient Instructions (Signed)

## 2023-08-29 NOTE — Progress Notes (Unsigned)
 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   INTERVAL HISTORY: Patient returns to clinic today for repeat laboratory, further evaluation, and continuation of phlebotomy.  She continues to feel well and remains asymptomatic.  She has no neurologic complaints.  She denies any recent fevers or illnesses.  She has a good appetite and denies weight loss.  She has no chest pain, shortness of breath, cough, or hemoptysis.  She denies any nausea, vomiting, constipation, or diarrhea.  She has no urinary complaints.  Patient offers no specific complaints today.  REVIEW OF SYSTEMS:   Review of Systems  Constitutional: Negative.  Negative for fever, malaise/fatigue and weight loss.  Respiratory: Negative.  Negative for cough, hemoptysis and shortness of breath.   Cardiovascular: Negative.  Negative for chest pain and leg swelling.  Gastrointestinal: Negative.  Negative for abdominal pain.  Genitourinary: Negative.  Negative for dysuria.  Musculoskeletal: Negative.  Negative for back pain.  Skin: Negative.  Negative for rash.  Neurological: Negative.  Negative for dizziness, focal weakness, weakness and headaches.  Psychiatric/Behavioral: Negative.  The patient is not nervous/anxious.     As per HPI. Otherwise, a complete review of systems is negative.  PAST MEDICAL HISTORY: Past Medical History:  Diagnosis Date   Anxiety    BRCA negative 08/2022   MyRiski neg except SDHD VUS   Family history of breast cancer    Fibroadenoma of both breasts    Hemochromatosis    Increased risk of breast  cancer 08/2022   IBIS=42.3%    PAST SURGICAL HISTORY: Past Surgical History:  Procedure Laterality Date   BREAST BIOPSY Right 08/17/2022   Us  Core Ribbon Clip- path pending   BREAST BIOPSY Right 08/17/2022   US  RT BREAST BX W LOC DEV 1ST LESION IMG BX SPEC US  GUIDE 08/17/2022 [facility]   BREAST BIOPSY  01/19/2023   MM RT RADIOACTIVE SEED LOC MAMMO GUIDE 01/19/2023 [facility]   BREAST BIOPSY  01/19/2023   MM RT RADIOACTIVE SEED LOC MAMMO GUIDE 01/19/2023 [facility]   BREAST BIOPSY  01/19/2023   MM RT RADIOACTIVE SEED EA ADD LESION LOC MAMMO GUIDE 01/19/2023 [facility]   BREAST SURGERY     left breast mass, biopsy, benign      RADIOACTIVE SEED GUIDED EXCISIONAL BREAST BIOPSY Right 01/24/2023   Procedure: RIGHT BREAST SEED GUIDED EXCISIONAL BIOPSY x3;  Surgeon: Enid Harry, MD;  Location: [facility];  Service: General;  Laterality: Right;  LMA    FAMILY HISTORY: Family History  Problem Relation Age of Onset   Colon polyps Mother    Diabetes Paternal Grandmother    Breast cancer Paternal Grandmother        77s   Breast cancer Cousin        early 12s, triple negative type; gene neg   Breast cancer Other        passed in her 39s with breast cancer   Prostate cancer Maternal Uncle        doing treatment   Prostate cancer Maternal Uncle     ADVANCED DIRECTIVES (Y/N):  N  HEALTH MAINTENANCE: Social History   Tobacco Use   Smoking status: Never  Smokeless tobacco: Never  Vaping Use   Vaping status: Never Used  Substance Use Topics   Alcohol use: Yes    Alcohol/week: 1.0 - 2.0 standard drink of alcohol    Types: 1 - 2 Standard drinks or equivalent per week    Comment: occas   Drug use: No     Colonoscopy:  PAP:  Bone density:  Lipid panel:  No Known Allergies  Current Outpatient Medications  Medication Sig Dispense Refill   Multiple Vitamin (MULTIVITAMIN) capsule Take 1 capsule by mouth daily.     sertraline  (ZOLOFT )  100 MG tablet Take 1 tablet (100 mg total) by mouth daily. 90 tablet 2   Vitamin D, Cholecalciferol, 10 MCG (400 UNIT) TABS Take by mouth.     hydrOXYzine  (VISTARIL ) 25 MG capsule Take 1 capsule (25 mg total) by mouth at bedtime as needed for anxiety (insomnia). (Patient not taking: Reported on 07/06/2023) 30 capsule 0   No current facility-administered medications for this visit.    OBJECTIVE: Vitals:   08/29/23 1337  BP: 111/78  Pulse: 96  Resp: 16  Temp: 97.7 F (36.5 C)  SpO2: 100%     Body mass index is 27.52 kg/m.    ECOG FS:0 - Asymptomatic  General: Well-developed, well-nourished, no acute distress. Eyes: Pink conjunctiva, anicteric sclera. HEENT: Normocephalic, moist mucous membranes. Lungs: No audible wheezing or coughing. Heart: Regular rate and rhythm. Abdomen: Soft, nontender, no obvious distention. Musculoskeletal: No edema, cyanosis, or clubbing. Neuro: Alert, answering all questions appropriately. Cranial nerves grossly intact. Skin: No rashes or petechiae noted. Psych: Normal affect.  LAB RESULTS:  Lab Results  Component Value Date   NA 141 08/27/2023   K 3.9 08/27/2023   CL 105 08/27/2023   CO2 27 08/27/2023   GLUCOSE 102 (H) 08/27/2023   BUN 13 08/27/2023   CREATININE 0.83 08/27/2023   CALCIUM 9.3 08/27/2023   PROT 7.6 08/27/2023   ALBUMIN 4.6 08/27/2023   AST 27 08/27/2023   ALT 25 08/27/2023   ALKPHOS 59 08/27/2023   BILITOT 1.0 08/27/2023   GFRNONAA >60 08/27/2023   GFRAA 147 08/01/2019    Lab Results  Component Value Date   WBC 4.9 08/27/2023   NEUTROABS 2.5 08/27/2023   HGB 14.5 08/27/2023   HCT 42.4 08/27/2023   MCV 97.2 08/27/2023   PLT 227 08/27/2023   Lab Results  Component Value Date   IRON 210 (H) 08/27/2023   TIBC 267 08/27/2023   IRONPCTSAT 79 (H) 08/27/2023   Lab Results  Component Value Date   FERRITIN 38 08/27/2023     STUDIES: MM 3D DIAG BILAT WITH CONTRAST BCG ONLY Result Date: 08/06/2023 CLINICAL DATA:   38 year old female presents for high risk screening CEDM. History of bilateral fibroadenomas with surgical excisions. EXAM: DIAGNOSTIC CONTRAST ENHANCED DIGITAL MAMMOGRAM BILATERAL 3D TECHNIQUE: Bilateral digital diagnostic mammography and breast tomosynthesis were performed. Contrast enhanced images were acquired following the intravenous administration of 100 milliliters of Isovue  370. Contrast reactions: none Labs: none COMPARISON:  Previous mammograms and MR. ACR Breast Density Category b: There are scattered areas of fibroglandular density. Background parenchymal enhancement: Minimal.  Symmetric. FINDINGS: On low energy exam, surgical changes within both breasts identified. No other new or suspicious findings identified. On recombined examinations, no new or suspicious abnormalities are noted. IMPRESSION: No evidence of breast malignancy. RECOMMENDATION: Bilateral screening CEDM or screening mammogram/screening breast MRI in 1 year in this high risk patient. I have discussed the findings and recommendations with the patient. If  applicable, a reminder letter will be sent to the patient regarding the next appointment. BI-RADS CATEGORY  2: Benign. Electronically Signed   By: Sundra Engel M.D.   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   PLAN:    Hereditary hemochromatosis: Patient's hemoglobin and AFP continue to be within normal limits.  Ferritin is 38.  Her iron saturation decreased from 96% to 79% with 500 mL of phlebotomy.  I recommended proceeding with 500 mL phlebotomy today and returning in 1 month, but patient wishes to only do 250 mL phlebotomy and check labs in 2 weeks.  Return to clinic in 2 weeks for lab only and then in 4 weeks for laboratory work, further evaluation, and continuation of phlebotomy if needed.   Transaminitis: Resolved.  I spent a total of 20 minutes reviewing chart data, face-to-face evaluation with the patient,  counseling and coordination of care as detailed above.   Patient expressed understanding and was in agreement with this plan. She also understands that She can call clinic at any time with any questions, concerns, or complaints.    Shellie Dials, MD   08/29/2023 2:10 PM

## 2023-08-30 ENCOUNTER — Encounter: Payer: Self-pay | Admitting: Oncology

## 2023-09-03 DIAGNOSIS — G4733 Obstructive sleep apnea (adult) (pediatric): Secondary | ICD-10-CM | POA: Diagnosis not present

## 2023-09-11 ENCOUNTER — Other Ambulatory Visit: Payer: Self-pay | Admitting: *Deleted

## 2023-09-12 ENCOUNTER — Inpatient Hospital Stay

## 2023-09-12 DIAGNOSIS — R9230 Dense breasts, unspecified: Secondary | ICD-10-CM | POA: Diagnosis not present

## 2023-09-12 DIAGNOSIS — Z803 Family history of malignant neoplasm of breast: Secondary | ICD-10-CM | POA: Diagnosis not present

## 2023-09-12 DIAGNOSIS — Z833 Family history of diabetes mellitus: Secondary | ICD-10-CM | POA: Diagnosis not present

## 2023-09-12 DIAGNOSIS — Z83719 Family history of colon polyps, unspecified: Secondary | ICD-10-CM | POA: Diagnosis not present

## 2023-09-12 DIAGNOSIS — Z8042 Family history of malignant neoplasm of prostate: Secondary | ICD-10-CM | POA: Diagnosis not present

## 2023-09-12 DIAGNOSIS — Z86018 Personal history of other benign neoplasm: Secondary | ICD-10-CM | POA: Diagnosis not present

## 2023-09-12 DIAGNOSIS — Z79899 Other long term (current) drug therapy: Secondary | ICD-10-CM | POA: Diagnosis not present

## 2023-09-12 LAB — CBC WITH DIFFERENTIAL/PLATELET
Abs Immature Granulocytes: 0.01 10*3/uL (ref 0.00–0.07)
Basophils Absolute: 0.1 10*3/uL (ref 0.0–0.1)
Basophils Relative: 1 %
Eosinophils Absolute: 0.3 10*3/uL (ref 0.0–0.5)
Eosinophils Relative: 4 %
HCT: 39.9 % (ref 36.0–46.0)
Hemoglobin: 14 g/dL (ref 12.0–15.0)
Immature Granulocytes: 0 %
Lymphocytes Relative: 22 %
Lymphs Abs: 1.8 10*3/uL (ref 0.7–4.0)
MCH: 33.6 pg (ref 26.0–34.0)
MCHC: 35.1 g/dL (ref 30.0–36.0)
MCV: 95.7 fL (ref 80.0–100.0)
Monocytes Absolute: 0.7 10*3/uL (ref 0.1–1.0)
Monocytes Relative: 8 %
Neutro Abs: 5.1 10*3/uL (ref 1.7–7.7)
Neutrophils Relative %: 65 %
Platelets: 279 10*3/uL (ref 150–400)
RBC: 4.17 MIL/uL (ref 3.87–5.11)
RDW: 11.5 % (ref 11.5–15.5)
WBC: 7.8 10*3/uL (ref 4.0–10.5)
nRBC: 0 % (ref 0.0–0.2)

## 2023-09-12 LAB — IRON AND TIBC
Iron: 80 ug/dL (ref 28–170)
Saturation Ratios: 34 % — ABNORMAL HIGH (ref 10.4–31.8)
TIBC: 237 ug/dL — ABNORMAL LOW (ref 250–450)
UIBC: 157 ug/dL

## 2023-09-12 LAB — FERRITIN: Ferritin: 27 ng/mL (ref 11–307)

## 2023-09-25 ENCOUNTER — Inpatient Hospital Stay

## 2023-09-27 ENCOUNTER — Inpatient Hospital Stay: Admitting: Oncology

## 2023-09-27 ENCOUNTER — Inpatient Hospital Stay

## 2023-10-04 DIAGNOSIS — G4733 Obstructive sleep apnea (adult) (pediatric): Secondary | ICD-10-CM | POA: Diagnosis not present

## 2023-10-25 ENCOUNTER — Encounter: Payer: Self-pay | Admitting: Family Medicine

## 2023-10-25 ENCOUNTER — Encounter: Payer: Self-pay | Admitting: Oncology

## 2023-10-25 ENCOUNTER — Encounter (INDEPENDENT_AMBULATORY_CARE_PROVIDER_SITE_OTHER): Payer: Self-pay

## 2023-10-30 ENCOUNTER — Other Ambulatory Visit
Admission: RE | Admit: 2023-10-30 | Discharge: 2023-10-30 | Disposition: A | Discharge status: Home / Self Care | Payer: Self-pay | Source: Ambulatory Visit | Attending: Medical Genetics | Admitting: Medical Genetics

## 2023-10-30 DIAGNOSIS — Z006 Encounter for examination for normal comparison and control in clinical research program: Secondary | ICD-10-CM | POA: Insufficient documentation

## 2023-11-03 DIAGNOSIS — G4733 Obstructive sleep apnea (adult) (pediatric): Secondary | ICD-10-CM | POA: Diagnosis not present

## 2023-11-12 LAB — GENECONNECT MOLECULAR SCREEN: Genetic Analysis Overall Interpretation: NEGATIVE

## 2023-11-15 ENCOUNTER — Encounter: Payer: Self-pay | Admitting: Obstetrics and Gynecology

## 2023-11-15 ENCOUNTER — Ambulatory Visit: Admitting: Obstetrics and Gynecology

## 2023-11-15 VITALS — BP 103/66 | HR 61 | Ht 65.0 in | Wt 170.0 lb

## 2023-11-15 DIAGNOSIS — Z1501 Genetic susceptibility to malignant neoplasm of breast: Secondary | ICD-10-CM | POA: Diagnosis not present

## 2023-11-15 DIAGNOSIS — F419 Anxiety disorder, unspecified: Secondary | ICD-10-CM

## 2023-11-15 DIAGNOSIS — Z01419 Encounter for gynecological examination (general) (routine) without abnormal findings: Secondary | ICD-10-CM

## 2023-11-15 DIAGNOSIS — Z01411 Encounter for gynecological examination (general) (routine) with abnormal findings: Secondary | ICD-10-CM | POA: Diagnosis not present

## 2023-11-15 DIAGNOSIS — Z803 Family history of malignant neoplasm of breast: Secondary | ICD-10-CM

## 2023-11-15 DIAGNOSIS — Z1231 Encounter for screening mammogram for malignant neoplasm of breast: Secondary | ICD-10-CM

## 2023-11-15 DIAGNOSIS — Z9189 Other specified personal risk factors, not elsewhere classified: Secondary | ICD-10-CM

## 2023-11-15 MED ORDER — SERTRALINE HCL 50 MG PO TABS: 50.0000 mg | ORAL_TABLET | Freq: Every day | ORAL | 3 refills | Status: AC

## 2023-11-15 NOTE — Progress Notes (Signed)
 PCP:  Tammy Marsa PARAS, DO   Chief Complaint  Patient presents with   Gynecologic Exam     HPI:      [name] is a 38 y.o. [phone] whose LMP was Patient's last menstrual period was 10/24/2023 (exact date)., presents today for her annual examination.  Her menses are regular every 24-36 days (used to be 28-30 days until this yr), lasting 4-5 days, mod flow, mild to no dysmen, no meds needed.  No BTB.  Sex activity: single partner, contraception - none-NFP/declines BC; husband may get vasectomy. No pain/bleeding/dryness.  Last Pap: 08/31/22 Results were: no abnormalities /neg HPV DNA   Last mammogram: 08/06/23 (CT enhanced mammo)  Results were: normal--routine follow-up in 12 months, followed by DR. Wakefield.  There is a FH of breast cancer on her pat side. There is no FH of ovarian cancer. The patient does do self-breast exams. Pt is neg MyRisk with SDHD VUS. IBIS=42.3%. Pt aware of recommendations of monthly SBE, Q6-12 month CBE, yearly mammos and breast MRI. Discussed prophylactic mastectomy and tamoxifen.  Pt is s/p RT breast bx with fibroadenoma 5/24; with fibroadenoma exc with Dr. Ebbie 10/24.    Tobacco use: The patient denies current or previous tobacco use. Alcohol use: social drinker No drug use.  Exercise: moderately active  She does get adequate calcium and Vitamin D in her diet. Labs with PCP.  Started sertraline  last yr for anxiety, on 100 mg dose with sx relief. Feels better, and life circumstances improved. Would like to wean down or off and see how she does. Feels great on 100 mg dose but unsure if she still needs it. Notes she is a generally anxious person in general though.   Has had issues with fatigue. Sleeps 7 hrs nightly. Dx with sleep apnea earlier this yr and now on CPAP. Fatigue sx improved but still notes needing a nap sometimes. Taking MVI, exercising. Has young children. Normal labs with PCP  Patient Active Problem List    Diagnosis Date Noted   Obstructive sleep apnea on CPAP 05/23/2023   Stress incontinence in female 02/22/2017   Hemochromatosis 05/31/2016   History of breast surgery 05/22/2016   Hereditary hemochromatosis (HCC) 05/22/2016    Past Surgical History:  Procedure Laterality Date   BREAST BIOPSY Right 08/17/2022   Us  Core Ribbon Clip- path pending   BREAST BIOPSY Right 08/17/2022   US  RT BREAST BX W LOC DEV 1ST LESION IMG BX SPEC US  GUIDE 08/17/2022 [facility]   BREAST BIOPSY  01/19/2023   MM RT RADIOACTIVE SEED LOC MAMMO GUIDE 01/19/2023 [facility]   BREAST BIOPSY  01/19/2023   MM RT RADIOACTIVE SEED LOC MAMMO GUIDE 01/19/2023 [facility]   BREAST BIOPSY  01/19/2023   MM RT RADIOACTIVE SEED EA ADD LESION LOC MAMMO GUIDE 01/19/2023 [facility]   BREAST SURGERY     left breast mass, biopsy, benign      RADIOACTIVE SEED GUIDED EXCISIONAL BREAST BIOPSY Right 01/24/2023   Procedure: RIGHT BREAST SEED GUIDED EXCISIONAL BIOPSY x3;  Surgeon: Ebbie Cough, MD;  Location: [facility];  Service: General;  Laterality: Right;  LMA    Family History  Problem Relation Age of Onset   Colon polyps Mother    Diabetes Paternal Grandmother    Breast cancer Paternal Grandmother        70s   Breast cancer Cousin        early 64s, triple negative type; gene neg   Breast  cancer Other        passed in her 28s with breast cancer   Prostate cancer Maternal Uncle        doing treatment   Prostate cancer Maternal Uncle     Social History   Socioeconomic History   Marital status: Married    Spouse name: Tammy Barton   Number of children: Not on file   Years of education: Not on file   Highest education level: Not on file  Occupational History   Not on file  Tobacco Use   Smoking status: Never   Smokeless tobacco: Never  Vaping Use   Vaping status: Never Used  Substance and Sexual Activity   Alcohol use: Yes    Alcohol/week: 1.0 - 2.0 standard drink of alcohol     Types: 1 - 2 Standard drinks or equivalent per week    Comment: occas   Drug use: No   Sexual activity: Yes    Birth control/protection: None  Other Topics Concern   Not on file  Social History Narrative   Not on file   Social Drivers of Health   Financial Resource Strain: Not on file  Food Insecurity: Not on file  Transportation Needs: Not on file  Physical Activity: Not on file  Stress: Not on file  Social Connections: Not on file  Intimate Partner Violence: Not on file     Current Outpatient Medications:    Multiple Vitamin (MULTIVITAMIN) capsule, Take 1 capsule by mouth daily., Disp: , Rfl:    Vitamin D, Cholecalciferol, 10 MCG (400 UNIT) TABS, Take by mouth., Disp: , Rfl:    sertraline  (ZOLOFT ) 50 MG tablet, Take 1 tablet (50 mg total) by mouth daily., Disp: 90 tablet, Rfl: 3     ROS:  Review of Systems  Constitutional:  Positive for fatigue. Negative for fever and unexpected weight change.  Respiratory:  Negative for cough, shortness of breath and wheezing.   Cardiovascular:  Negative for chest pain, palpitations and leg swelling.  Gastrointestinal:  Negative for blood in stool, constipation, diarrhea, nausea and vomiting.  Endocrine: Negative for cold intolerance, heat intolerance and polyuria.  Genitourinary:  Negative for dyspareunia, dysuria, flank pain, frequency, genital sores, hematuria, menstrual problem, pelvic pain, urgency, vaginal bleeding, vaginal discharge and vaginal pain.  Musculoskeletal:  Negative for back pain, joint swelling and myalgias.  Skin:  Negative for rash.  Neurological:  Negative for dizziness, syncope, light-headedness, numbness and headaches.  Hematological:  Negative for adenopathy.  Psychiatric/Behavioral:  Positive for agitation. Negative for confusion, sleep disturbance and suicidal ideas. The patient is not nervous/anxious.    BREAST: No symptoms   Objective: BP 103/66   Pulse 61   Ht 5' 5 (1.651 m)   Wt 170 lb (77.1 kg)    LMP 10/24/2023 (Exact Date)   BMI 28.29 kg/m    Physical Exam Constitutional:      Appearance: She is well-developed.  Genitourinary:     Vulva normal.     Right Labia: No rash, tenderness or lesions.    Left Labia: No tenderness, lesions or rash.    No vaginal discharge, erythema or tenderness.      Right Adnexa: not tender and no mass present.    Left Adnexa: not tender and no mass present.    No cervical friability or polyp.     Uterus is not enlarged or tender.  Breasts:    Right: No mass, nipple discharge, skin change or tenderness.     Left: No  mass, nipple discharge, skin change or tenderness.  Neck:     Thyroid: No thyromegaly.  Cardiovascular:     Rate and Rhythm: Normal rate and regular rhythm.     Heart sounds: Normal heart sounds. No murmur heard. Pulmonary:     Effort: Pulmonary effort is normal.     Breath sounds: Normal breath sounds.  Abdominal:     Palpations: Abdomen is soft.     Tenderness: There is no abdominal tenderness. There is no guarding or rebound.  Musculoskeletal:        General: Normal range of motion.     Cervical back: Normal range of motion.  Lymphadenopathy:     Cervical: No cervical adenopathy.  Neurological:     General: No focal deficit present.     Mental Status: She is alert and oriented to person, place, and time.     Cranial Nerves: No cranial nerve deficit.  Skin:    General: Skin is warm and dry.  Psychiatric:        Mood and Affect: Mood normal.        Behavior: Behavior normal.        Thought Content: Thought content normal.        Judgment: Judgment normal.  Vitals reviewed.     Assessment/Plan: Encounter for annual routine gynecological examination  Encounter for screening mammogram for malignant neoplasm of breast; pt current on mammo, followed by DR. Wakefield.   Family history of breast cancer--pt is MyRisk neg.   Increased risk of breast cancer--aware of recommendations of monthly SBE, yearly CBE and  mammos, as well as scr breast MRI. Will call for MRI ref prn.   Anxiety - Plan: sertraline  (ZOLOFT ) 50 MG tablet; Rx 50 mg tab to wean down or off. F/u prn.   Fatigue--increase sleep, ok to nap prn, cont exercise/MVI.   Meds ordered this encounter  Medications   sertraline  (ZOLOFT ) 50 MG tablet    Sig: Take 1 tablet (50 mg total) by mouth daily.    Dispense:  90 tablet    Refill:  3    Supervising Provider:   ROBY, MICIA [[id number]]             GYN counsel breast self exam, mammography screening, adequate intake of calcium and vitamin D, diet and exercise     F/U  Return in about 1 year (around 11/14/2024).  Tammy Scinto B. Artavia Jeanlouis, PA-C 11/15/2023 4:17 PM

## 2023-11-15 NOTE — Patient Instructions (Signed)
 I value your feedback and you entrusting [place] with your care. If you get a [place] patient survey, I would appreciate you taking the time to let us know about your experience today. Thank you! ? ? ?

## 2023-12-04 DIAGNOSIS — G4733 Obstructive sleep apnea (adult) (pediatric): Secondary | ICD-10-CM | POA: Diagnosis not present

## 2023-12-13 ENCOUNTER — Other Ambulatory Visit: Payer: Self-pay | Admitting: *Deleted

## 2023-12-14 ENCOUNTER — Inpatient Hospital Stay: Attending: Oncology

## 2023-12-14 DIAGNOSIS — Z79899 Other long term (current) drug therapy: Secondary | ICD-10-CM | POA: Insufficient documentation

## 2023-12-14 LAB — CBC WITH DIFFERENTIAL (CANCER CENTER ONLY)
Abs Immature Granulocytes: 0.01 K/uL (ref 0.00–0.07)
Basophils Absolute: 0.1 K/uL (ref 0.0–0.1)
Basophils Relative: 1 %
Eosinophils Absolute: 0.3 K/uL (ref 0.0–0.5)
Eosinophils Relative: 5 %
HCT: 39.9 % (ref 36.0–46.0)
Hemoglobin: 13.7 g/dL (ref 12.0–15.0)
Immature Granulocytes: 0 %
Lymphocytes Relative: 38 %
Lymphs Abs: 2.4 K/uL (ref 0.7–4.0)
MCH: 33 pg (ref 26.0–34.0)
MCHC: 34.3 g/dL (ref 30.0–36.0)
MCV: 96.1 fL (ref 80.0–100.0)
Monocytes Absolute: 0.5 K/uL (ref 0.1–1.0)
Monocytes Relative: 8 %
Neutro Abs: 3 K/uL (ref 1.7–7.7)
Neutrophils Relative %: 48 %
Platelet Count: 263 K/uL (ref 150–400)
RBC: 4.15 MIL/uL (ref 3.87–5.11)
RDW: 12.2 % (ref 11.5–15.5)
WBC Count: 6.4 K/uL (ref 4.0–10.5)
nRBC: 0 % (ref 0.0–0.2)

## 2023-12-14 LAB — FERRITIN: Ferritin: 31 ng/mL (ref 11–307)

## 2023-12-14 LAB — IRON AND TIBC
Iron: 132 ug/dL (ref 28–170)
Saturation Ratios: 56 % — ABNORMAL HIGH (ref 10.4–31.8)
TIBC: 238 ug/dL — ABNORMAL LOW (ref 250–450)
UIBC: 106 ug/dL

## 2024-01-16 ENCOUNTER — Other Ambulatory Visit: Payer: Self-pay | Admitting: Obstetrics and Gynecology

## 2024-01-16 DIAGNOSIS — F419 Anxiety disorder, unspecified: Secondary | ICD-10-CM

## 2024-01-24 DIAGNOSIS — F840 Autistic disorder: Secondary | ICD-10-CM | POA: Diagnosis not present

## 2024-02-04 DIAGNOSIS — F840 Autistic disorder: Secondary | ICD-10-CM | POA: Diagnosis not present

## 2024-02-05 ENCOUNTER — Encounter: Payer: Self-pay | Admitting: Oncology

## 2024-02-06 ENCOUNTER — Other Ambulatory Visit: Payer: Self-pay | Admitting: *Deleted

## 2024-02-07 ENCOUNTER — Other Ambulatory Visit: Payer: Self-pay | Admitting: Oncology

## 2024-02-07 ENCOUNTER — Inpatient Hospital Stay: Attending: Oncology

## 2024-02-07 ENCOUNTER — Inpatient Hospital Stay

## 2024-02-07 DIAGNOSIS — Z79899 Other long term (current) drug therapy: Secondary | ICD-10-CM | POA: Diagnosis not present

## 2024-02-07 LAB — CBC WITH DIFFERENTIAL/PLATELET
Abs Immature Granulocytes: 0.01 K/uL (ref 0.00–0.07)
Basophils Absolute: 0.1 K/uL (ref 0.0–0.1)
Basophils Relative: 1 %
Eosinophils Absolute: 0.2 K/uL (ref 0.0–0.5)
Eosinophils Relative: 2 %
HCT: 40.4 % (ref 36.0–46.0)
Hemoglobin: 14 g/dL (ref 12.0–15.0)
Immature Granulocytes: 0 %
Lymphocytes Relative: 28 %
Lymphs Abs: 2 K/uL (ref 0.7–4.0)
MCH: 32.9 pg (ref 26.0–34.0)
MCHC: 34.7 g/dL (ref 30.0–36.0)
MCV: 94.8 fL (ref 80.0–100.0)
Monocytes Absolute: 0.4 K/uL (ref 0.1–1.0)
Monocytes Relative: 6 %
Neutro Abs: 4.5 K/uL (ref 1.7–7.7)
Neutrophils Relative %: 63 %
Platelets: 255 K/uL (ref 150–400)
RBC: 4.26 MIL/uL (ref 3.87–5.11)
RDW: 11.6 % (ref 11.5–15.5)
WBC: 7.1 K/uL (ref 4.0–10.5)
nRBC: 0 % (ref 0.0–0.2)

## 2024-02-07 LAB — FERRITIN: Ferritin: 53 ng/mL (ref 11–307)

## 2024-02-07 LAB — IRON AND TIBC
Iron: 100 ug/dL (ref 28–170)
Saturation Ratios: 48 % — ABNORMAL HIGH (ref 10.4–31.8)
TIBC: 210 ug/dL — ABNORMAL LOW (ref 250–450)
UIBC: 110 ug/dL

## 2024-02-11 DIAGNOSIS — F840 Autistic disorder: Secondary | ICD-10-CM | POA: Diagnosis not present

## 2024-02-21 DIAGNOSIS — F840 Autistic disorder: Secondary | ICD-10-CM | POA: Diagnosis not present

## 2024-02-25 DIAGNOSIS — F840 Autistic disorder: Secondary | ICD-10-CM | POA: Diagnosis not present

## 2024-02-28 DIAGNOSIS — F840 Autistic disorder: Secondary | ICD-10-CM | POA: Diagnosis not present

## 2024-03-03 ENCOUNTER — Encounter: Payer: Self-pay | Admitting: Obstetrics and Gynecology

## 2024-03-03 ENCOUNTER — Other Ambulatory Visit: Payer: Self-pay | Admitting: Obstetrics and Gynecology

## 2024-03-03 DIAGNOSIS — R92323 Mammographic fibroglandular density, bilateral breasts: Secondary | ICD-10-CM | POA: Diagnosis not present

## 2024-03-03 DIAGNOSIS — F419 Anxiety disorder, unspecified: Secondary | ICD-10-CM

## 2024-03-03 DIAGNOSIS — Z803 Family history of malignant neoplasm of breast: Secondary | ICD-10-CM | POA: Diagnosis not present

## 2024-03-04 DIAGNOSIS — F840 Autistic disorder: Secondary | ICD-10-CM | POA: Diagnosis not present

## 2024-03-11 DIAGNOSIS — F840 Autistic disorder: Secondary | ICD-10-CM | POA: Diagnosis not present

## 2024-07-18 ENCOUNTER — Encounter: Admitting: Family Medicine
